# Patient Record
Sex: Male | Born: 1970 | Race: Black or African American | Hispanic: No | Marital: Married | Smoking: Current every day smoker
Health system: Southern US, Community
[De-identification: ages and names within clinical notes are randomized; demographics above are authoritative.]

## PROBLEM LIST (undated history)

## (undated) HISTORY — PX: OTHER SURGICAL HISTORY: SHX169

---

## 2001-02-28 ENCOUNTER — Emergency Department (HOSPITAL_COMMUNITY): Admission: EM | Admit: 2001-02-28 | Discharge: 2001-02-28 | Payer: Self-pay | Admitting: *Deleted

## 2003-05-06 ENCOUNTER — Emergency Department (HOSPITAL_COMMUNITY): Admission: AC | Admit: 2003-05-06 | Discharge: 2003-05-06 | Payer: Self-pay

## 2003-05-07 ENCOUNTER — Emergency Department (HOSPITAL_COMMUNITY): Admission: EM | Admit: 2003-05-07 | Discharge: 2003-05-07 | Payer: Self-pay | Admitting: Emergency Medicine

## 2003-08-07 ENCOUNTER — Emergency Department (HOSPITAL_COMMUNITY): Admission: EM | Admit: 2003-08-07 | Discharge: 2003-08-07 | Payer: Self-pay | Admitting: Emergency Medicine

## 2004-01-24 ENCOUNTER — Emergency Department (HOSPITAL_COMMUNITY): Admission: EM | Admit: 2004-01-24 | Discharge: 2004-01-24 | Payer: Self-pay | Admitting: Emergency Medicine

## 2004-01-29 ENCOUNTER — Ambulatory Visit: Payer: Self-pay | Admitting: Family Medicine

## 2004-01-31 ENCOUNTER — Ambulatory Visit: Payer: Self-pay | Admitting: Family Medicine

## 2004-06-23 ENCOUNTER — Ambulatory Visit: Payer: Self-pay | Admitting: Internal Medicine

## 2005-03-15 ENCOUNTER — Emergency Department (HOSPITAL_COMMUNITY): Admission: EM | Admit: 2005-03-15 | Discharge: 2005-03-15 | Payer: Self-pay | Admitting: Emergency Medicine

## 2005-04-18 ENCOUNTER — Emergency Department (HOSPITAL_COMMUNITY): Admission: EM | Admit: 2005-04-18 | Discharge: 2005-04-18 | Payer: Self-pay | Admitting: *Deleted

## 2007-12-08 ENCOUNTER — Encounter (INDEPENDENT_AMBULATORY_CARE_PROVIDER_SITE_OTHER): Payer: Self-pay | Admitting: *Deleted

## 2008-11-26 ENCOUNTER — Ambulatory Visit: Payer: Self-pay | Admitting: Nurse Practitioner

## 2008-11-26 DIAGNOSIS — K219 Gastro-esophageal reflux disease without esophagitis: Secondary | ICD-10-CM | POA: Insufficient documentation

## 2009-01-08 ENCOUNTER — Ambulatory Visit: Payer: Self-pay | Admitting: Nurse Practitioner

## 2009-01-08 DIAGNOSIS — F17200 Nicotine dependence, unspecified, uncomplicated: Secondary | ICD-10-CM

## 2009-01-08 DIAGNOSIS — K921 Melena: Secondary | ICD-10-CM

## 2009-01-09 DIAGNOSIS — A5601 Chlamydial cystitis and urethritis: Secondary | ICD-10-CM

## 2009-05-21 ENCOUNTER — Emergency Department (HOSPITAL_COMMUNITY): Admission: EM | Admit: 2009-05-21 | Discharge: 2009-05-22 | Payer: Self-pay | Admitting: Emergency Medicine

## 2009-06-04 ENCOUNTER — Ambulatory Visit (HOSPITAL_COMMUNITY): Admission: RE | Admit: 2009-06-04 | Discharge: 2009-06-05 | Payer: Self-pay | Admitting: Orthopedic Surgery

## 2009-06-26 ENCOUNTER — Emergency Department (HOSPITAL_COMMUNITY): Admission: EM | Admit: 2009-06-26 | Discharge: 2009-06-26 | Payer: Self-pay | Admitting: Family Medicine

## 2010-04-11 ENCOUNTER — Ambulatory Visit: Admit: 2010-04-11 | Payer: Self-pay | Admitting: Nurse Practitioner

## 2010-04-27 LAB — CONVERTED CEMR LAB
BUN: 15 mg/dL (ref 6–23)
Basophils Relative: 1 % (ref 0–1)
Bilirubin Urine: NEGATIVE
CO2: 25 meq/L (ref 19–32)
Calcium: 9.3 mg/dL (ref 8.4–10.5)
Chloride: 105 meq/L (ref 96–112)
Creatinine, Ser: 0.97 mg/dL (ref 0.40–1.50)
Eosinophils Absolute: 0.2 10*3/uL (ref 0.0–0.7)
Eosinophils Relative: 5 % (ref 0–5)
Glucose, Urine, Semiquant: NEGATIVE
HCT: 42.2 % (ref 39.0–52.0)
Helicobacter pylori, IgM: <0.89 (ref <0.89)
Lymphs Abs: 1.5 10*3/uL (ref 0.7–4.0)
MCHC: 33.6 g/dL (ref 30.0–36.0)
MCV: 89.2 fL (ref 78.0–100.0)
Monocytes Relative: 17 % — ABNORMAL HIGH (ref 3–12)
Platelets: 284 10*3/uL (ref 150–400)
Protein, U semiquant: NEGATIVE
TSH: 0.893 microintl units/mL (ref 0.350–4.500)
Total Bilirubin: 0.6 mg/dL (ref 0.3–1.2)
WBC: 3.6 10*3/uL — ABNORMAL LOW (ref 4.0–10.5)
pH: 5.5

## 2010-06-22 LAB — COMPREHENSIVE METABOLIC PANEL
ALT: 21 U/L (ref 0–53)
AST: 22 U/L (ref 0–37)
Albumin: 3.3 g/dL — ABNORMAL LOW (ref 3.5–5.2)
Alkaline Phosphatase: 50 U/L (ref 39–117)
Chloride: 104 mEq/L (ref 96–112)
GFR calc Af Amer: >60 mL/min (ref >60)
GFR calc non Af Amer: >60 mL/min (ref >60)
Potassium: 4.1 mEq/L (ref 3.5–5.1)
Sodium: 137 mEq/L (ref 135–145)
Total Bilirubin: 0.5 mg/dL (ref 0.3–1.2)

## 2010-06-22 LAB — POCT RAPID STREP A (OFFICE): Streptococcus, Group A Screen (Direct): NEGATIVE

## 2010-06-22 LAB — CBC
Platelets: 289 10*3/uL (ref 150–400)
WBC: 4.7 10*3/uL (ref 4.0–10.5)

## 2010-06-22 LAB — DIFFERENTIAL
Basophils Relative: 0 % (ref 0–1)
Eosinophils Absolute: 0.2 10*3/uL (ref 0.0–0.7)
Lymphs Abs: 1.3 10*3/uL (ref 0.7–4.0)
Monocytes Relative: 14 % — ABNORMAL HIGH (ref 3–12)
Neutro Abs: 2.6 10*3/uL (ref 1.7–7.7)
Neutrophils Relative %: 55 % (ref 43–77)

## 2010-10-02 ENCOUNTER — Emergency Department (HOSPITAL_COMMUNITY)
Admission: EM | Admit: 2010-10-02 | Discharge: 2010-10-02 | Disposition: A | Discharge status: Home / Self Care | Payer: Self-pay | Attending: Emergency Medicine | Admitting: Emergency Medicine

## 2010-10-02 DIAGNOSIS — S60569A Insect bite (nonvenomous) of unspecified hand, initial encounter: Secondary | ICD-10-CM | POA: Insufficient documentation

## 2010-10-02 DIAGNOSIS — W57XXXA Bitten or stung by nonvenomous insect and other nonvenomous arthropods, initial encounter: Secondary | ICD-10-CM | POA: Insufficient documentation

## 2010-10-02 DIAGNOSIS — R21 Rash and other nonspecific skin eruption: Secondary | ICD-10-CM | POA: Insufficient documentation

## 2012-03-21 ENCOUNTER — Emergency Department (INDEPENDENT_AMBULATORY_CARE_PROVIDER_SITE_OTHER)
Admission: EM | Admit: 2012-03-21 | Discharge: 2012-03-21 | Disposition: A | Discharge status: Home / Self Care | Payer: Self-pay | Source: Home / Self Care | Attending: Emergency Medicine | Admitting: Emergency Medicine

## 2012-03-21 ENCOUNTER — Encounter (HOSPITAL_COMMUNITY): Payer: Self-pay | Admitting: *Deleted

## 2012-03-21 DIAGNOSIS — H109 Unspecified conjunctivitis: Secondary | ICD-10-CM

## 2012-03-21 MED ORDER — HYDROXYPROPYL METHYLCELLULOSE 2.5 % OP SOLN: 1.0000 [drp] | Freq: Three times a day (TID) | OPHTHALMIC | Status: AC | PRN

## 2012-03-21 MED ORDER — TOBRAMYCIN 0.3 % OP SOLN: 1.0000 [drp] | Freq: Four times a day (QID) | OPHTHALMIC | Status: AC

## 2012-03-21 NOTE — ED Provider Notes (Signed)
History     CSN: [id number]  Arrival date & time 03/21/12  4098   First MD Initiated Contact with Patient 03/21/12 1931      No chief complaint on file.   (Consider location/radiation/quality/duration/timing/severity/associated sxs/prior treatment) HPI Comments: Patient presents to see urgent care complaining of left eye redness, tearing and discomfort. He denies any eye injury or any referring object sensation. Described that he's been having a cold for the last 3-4 days and have been outside working. Denies any changes in visio,fevers or headaches.  Patient is a 41 y.o. male presenting with conjunctivitis. The history is provided by the patient.  Conjunctivitis  The current episode started 3 to 5 days ago. The problem has been gradually worsening. Associated symptoms include eye itching, rhinorrhea and eye redness. Pertinent negatives include no decreased vision, no double vision, no photophobia, no nausea, no vomiting, no headaches, no hearing loss, no mouth sores, no swollen glands, no cough, no wheezing, no rash, no eye discharge and no eye pain. The eye pain is mild.    No past medical history on file.  No past surgical history on file.  No family history on file.  History  Substance Use Topics  . Smoking status: Not on file  . Smokeless tobacco: Not on file  . Alcohol Use: Not on file      Review of Systems  Constitutional: Negative for activity change and appetite change.  HENT: Positive for rhinorrhea. Negative for hearing loss and mouth sores.   Eyes: Positive for redness and itching. Negative for double vision, photophobia, pain, discharge and visual disturbance.  Respiratory: Negative for cough, shortness of breath and wheezing.   Gastrointestinal: Negative for nausea and vomiting.  Skin: Negative for rash and wound.  Neurological: Negative for dizziness, weakness and headaches.    Allergies  Review of patient's allergies indicates not on file.  Home  Medications   Current Outpatient Rx  Name  Route  Sig  Dispense  Refill  . HYDROXYPROPYL METHYLCELLULOSE 2.5 % OP SOLN   Right Eye   Place 1 drop into the right eye 3 (three) times daily as needed.   15 mL   12   . TOBRAMYCIN SULFATE 0.3 % OP SOLN   Right Eye   Place 1 drop into the right eye every 6 (six) hours.   5 mL   0     BP 115/74  Pulse 68  Temp 97.2 F (36.2 C) (Oral)  Resp 16  SpO2 98%  Physical Exam  Nursing note and vitals reviewed. Constitutional: Vital signs are normal. He appears well-developed and well-nourished.  Non-toxic appearance. He does not have a sickly appearance. He does not appear ill. No distress.  Eyes: No foreign bodies found. Right eye exhibits no discharge and no exudate. Left eye exhibits no discharge, no exudate and no hordeolum. No foreign body present in the left eye. Left conjunctiva has no hemorrhage. No scleral icterus. Left eye exhibits normal extraocular motion and no nystagmus.  Slit lamp exam:      The right eye shows no fluorescein uptake and no anterior chamber bulge.       The left eye shows no corneal abrasion, no corneal flare, no hyphema, no hypopyon, no fluorescein uptake and no anterior chamber bulge.  Pulmonary/Chest: Effort normal.  Skin: No erythema.    ED Course  Procedures (including critical care time)  Labs Reviewed - No data to display No results found.   1. Conjunctivitis  MDM  Possibly viral conjunctivitis as per recent cold-like symptoms. Patient has been prescribed tobramycin ophthalmic. Patient also advised to use artificial tears.        Jimmie Molly, MD 03/21/12 [phone]

## 2012-03-21 NOTE — ED Notes (Signed)
C/o pink eye OS onset Wednesday.  He tried clear eyes without relief.  Still red.  Has drainage in the corner of his eye in the morning.

## 2013-09-03 ENCOUNTER — Encounter (HOSPITAL_COMMUNITY): Payer: Self-pay | Admitting: Emergency Medicine

## 2013-09-03 ENCOUNTER — Emergency Department (INDEPENDENT_AMBULATORY_CARE_PROVIDER_SITE_OTHER)
Admission: EM | Admit: 2013-09-03 | Discharge: 2013-09-03 | Disposition: A | Discharge status: Home / Self Care | Payer: Self-pay | Source: Home / Self Care

## 2013-09-03 DIAGNOSIS — W57XXXA Bitten or stung by nonvenomous insect and other nonvenomous arthropods, initial encounter: Secondary | ICD-10-CM

## 2013-09-03 DIAGNOSIS — T148 Other injury of unspecified body region: Secondary | ICD-10-CM

## 2013-09-03 MED ORDER — FAMOTIDINE 20 MG PO TABS
ORAL_TABLET | ORAL | Status: AC
  Filled 2013-09-03: qty 1

## 2013-09-03 MED ORDER — TRIAMCINOLONE ACETONIDE 0.1 % EX CREA: TOPICAL_CREAM | CUTANEOUS | Status: DC

## 2013-09-03 MED ORDER — HYDROXYZINE HCL 25 MG PO TABS: 25.0000 mg | ORAL_TABLET | Freq: Four times a day (QID) | ORAL | Status: DC | PRN

## 2013-09-03 MED ORDER — PERMETHRIN 5 % EX CREA: TOPICAL_CREAM | CUTANEOUS | Status: DC

## 2013-09-03 MED ORDER — FAMOTIDINE 20 MG PO TABS
20.0000 mg | ORAL_TABLET | Freq: Once | ORAL | Status: AC
  Administered 2013-09-03: 20 mg via ORAL

## 2013-09-03 MED ORDER — PREDNISONE 20 MG PO TABS: ORAL_TABLET | ORAL | Status: DC

## 2013-09-03 MED ORDER — TRIAMCINOLONE ACETONIDE 40 MG/ML IJ SUSP
60.0000 mg | Freq: Once | INTRAMUSCULAR | Status: AC
  Administered 2013-09-03: 60 mg via INTRAMUSCULAR

## 2013-09-03 MED ORDER — TRIAMCINOLONE ACETONIDE 40 MG/ML IJ SUSP
INTRAMUSCULAR | Status: AC
  Filled 2013-09-03: qty 1

## 2013-09-03 NOTE — Discharge Instructions (Signed)
Bedbugs °Bedbugs are tiny bugs that live in and around beds. During the day, they hide in mattresses and other places near beds. They come out at night and bite people lying in bed. They need blood to live and grow. Bedbugs can be found in beds anywhere. Usually, they are found in places where many people come and go (hotels, shelters, hospitals). It does not matter whether the place is dirty or clean. °Getting bitten by bedbugs rarely causes a medical problem. The biggest problem can be getting rid of them.  This often takes the work of a pest control expert. °CAUSES °· Less use of pesticides. Bedbugs were common before the 1950s. Then, strong pesticides such as DDT nearly wiped them out. Today, these pesticides are not used because they harm the environment and can cause health problems. °· More travel. Besides mattresses, bedbugs can also live in clothing and luggage. They can come along as people travel from place to place. Bedbugs are more common in certain parts of the world. When people travel to those areas, the bugs can come home with them. °· Presence of birds and bats. Bedbugs often infest birds and bats. If you have these animals in or near your home, bedbugs may infest your house, too. °SYMPTOMS °It does not hurt to be bitten by a bedbug. You will probably not wake up when you are bitten. Bedbugs usually bite areas of the skin that are not covered. Symptoms may show when you wake up, or they may take a day or more to show up. Symptoms may include: °· Small red bumps on the skin. These might be lined up in a row or clustered in a group. °· A darker red dot in the middle of red bumps. °· Blisters on the skin. There may be swelling and very bad itching. These may be signs of an allergic reaction. This does not happen often. °DIAGNOSIS °Bedbug bites might look and feel like other types of insect bites. The bugs do not stay on the body like ticks or lice. They bite, drop off, and crawl away to hide. Your  caregiver will probably: °· Ask about your symptoms. °· Ask about your recent activities and travel. °· Check your skin for bedbug bites. °· Ask you to check at home for signs of bedbugs. You should look for: °· Spots or stains on the bed or nearby. This could be from bedbugs that were crushed or from their eggs or waste. °· Bedbugs themselves. They are reddish-brown, oval, and flat. They do not fly. They are about the size of an apple seed. °· Places to look for bedbugs include: °· Beds. Check mattresses, headboards, box springs, and bed frames. °· On drapes and curtains near the bed. °· Under carpeting in the bedroom. °· Behind electrical outlets. °· Behind any wallpaper that is peeling. °· Inside luggage. °TREATMENT °Most bedbug bites do not need treatment. They usually go away on their own in a few days. The bites are not dangerous. However, treatment may be needed if you have scratched so much that your skin has become infected. You may also need treatment if you are allergic to bedbug bites. Treatment options include: °· A drug that stops swelling and itching (corticosteroid). Usually, a cream is rubbed on the skin. If you have a bad rash, you may be given a corticosteroid pill. °· Oral antihistamines. These are pills to help control itching. °· Antibiotic medicines. An antibiotic may be prescribed for infected skin. °HOME CARE INSTRUCTIONS  °·   Take any medicine prescribed by your caregiver for your bites. Follow the directions carefully. °· Consider wearing pajamas with long sleeves and pant legs. °· Your bedroom may need to be treated. A pest control expert should make sure the bedbugs are gone. You may need to throw away mattresses or luggage. Ask the pest control expert what you can do to keep the bedbugs from coming back. Common suggestions include: °· Putting a plastic cover over your mattress. °· Washing and drying your clothes and bedding in hot water and a hot dryer. The temperature should be hotter  than 120° F (48.9° C). Bedbugs are killed by high temperatures. °· Vacuuming carefully all around your bed. Vacuum in all cracks and crevices where the bugs might hide. Do this often. °· Carefully checking all used furniture, bedding, or clothes that you bring into your house. °· Eliminating bird nests and bat roosts. °· If you get bedbug bites when traveling, check all your possessions carefully before bringing them into your house. If you find any bugs on clothes or in your luggage, consider throwing those items away. °SEEK MEDICAL CARE IF: °· You have red bug bites that keep coming back. °· You have red bug bites that itch badly. °· You have bug bites that cause a skin rash. °· You have scratch marks that are red and sore. °SEEK IMMEDIATE MEDICAL CARE IF: °You have a fever. °Document Released: 04/18/2010 Document Revised: 06/08/2011 Document Reviewed: 04/18/2010 °[employer]® Patient Information ©2014 [employer]. ° °Insect Bite °Mosquitoes, flies, fleas, bedbugs, and many other insects can bite. Insect bites are different from insect stings. A sting is when venom is injected into the skin. Some insect bites can transmit infectious diseases. °SYMPTOMS  °Insect bites usually turn red, swell, and itch for 2 to 4 days. They often go away on their own. °TREATMENT  °Your caregiver may prescribe antibiotic medicines if a bacterial infection develops in the bite. °HOME CARE INSTRUCTIONS °· Do not scratch the bite area. °· Keep the bite area clean and dry. Wash the bite area thoroughly with soap and water. °· Put ice or cool compresses on the bite area. °· Put ice in a plastic bag. °· Place a towel between your skin and the bag. °· Leave the ice on for 20 minutes, 4 times a day for the first 2 to 3 days, or as directed. °· You may apply a baking soda paste, cortisone cream, or calamine lotion to the bite area as directed by your caregiver. This can help reduce itching and swelling. °· Only take over-the-counter or  prescription medicines as directed by your caregiver. °· If you are given antibiotics, take them as directed. Finish them even if you start to feel better. °You may need a tetanus shot if: °· You cannot remember when you had your last tetanus shot. °· You have never had a tetanus shot. °· The injury broke your skin. °If you get a tetanus shot, your arm may swell, get red, and feel warm to the touch. This is common and not a problem. If you need a tetanus shot and you choose not to have one, there is a rare chance of getting tetanus. Sickness from tetanus can be serious. °SEEK IMMEDIATE MEDICAL CARE IF:  °· You have increased pain, redness, or swelling in the bite area. °· You see a red line on the skin coming from the bite. °· You have a fever. °· You have joint pain. °· You have a headache or neck   pain. °· You have unusual weakness. °· You have a rash. °· You have chest pain or shortness of breath. °· You have abdominal pain, nausea, or vomiting. °· You feel unusually tired or sleepy. °MAKE SURE YOU:  °· Understand these instructions. °· Will watch your condition. °· Will get help right away if you are not doing well or get worse. °Document Released: 04/23/2004 Document Revised: 06/08/2011 Document Reviewed: 10/15/2010 °ExitCare® Patient Information ©2014 [employer]. ° °

## 2013-09-03 NOTE — ED Notes (Signed)
States he noted itching and raised red areas on arms, neck, legs after visiting a friend's house last pm. NAD, w/d/color good, diffuse raised red circular areas noted he says are itchy. Minimal relief w benadryl

## 2013-09-03 NOTE — ED Provider Notes (Signed)
CSN: [id number]     Arrival date & time 09/03/13  1556 History   First MD Initiated Contact with Patient 09/03/13 1729     Chief Complaint  Patient presents with  . Skin Problem   (Consider location/radiation/quality/duration/timing/severity/associated sxs/prior Treatment) HPI Comments: 43 year old male went to a friend's house yesterday and sat on his couch. A few hours later he developed red nail did pruritic lesions scattered about his body surface areas. These are primarily concentrated to the upper extremities and the neck. Highly pruritic.   History reviewed. No pertinent past medical history. Past Surgical History  Procedure Laterality Date  . R hand surgery      fracture 4th and 5th metacarpal   Family History  Problem Relation Age of Onset  . Cancer Father    History  Substance Use Topics  . Smoking status: Current Every Day Smoker -- 1.00 packs/day    Types: Cigarettes  . Smokeless tobacco: Not on file  . Alcohol Use: Yes     Comment: occasional    Review of Systems  Constitutional: Negative for diaphoresis, activity change and fatigue.  HENT: Negative.   Respiratory: Negative.   Gastrointestinal: Negative.   Genitourinary: Negative.   Skin: Positive for rash.  Neurological: Negative.     Allergies  Review of patient's allergies indicates no known allergies.  Home Medications   Prior to Admission medications   Medication Sig Start Date End Date Taking? Authorizing Provider  hydrOXYzine (ATARAX/VISTARIL) 25 MG tablet Take 1 tablet (25 mg total) by mouth every 6 (six) hours as needed. For itching. May cause drowsiness. 09/03/13   Hayden Rasmussen, NP  Naphazoline-Glycerin (CLEAR EYES MAX REDNESS RELIEF OP) Apply 2 drops to eye QID.    Historical Provider, MD  permethrin (ELIMITE) 5 % cream Apply from chin to feet. Rinse off in 8 hours. 09/03/13   Hayden Rasmussen, NP  predniSONE (DELTASONE) 20 MG tablet 2 tabs po once daily x 5 days. Start 09/04/13. Take with food. 09/03/13    Hayden Rasmussen, NP  triamcinolone cream (KENALOG) 0.1 % Apply to affected areas bid prn rash. 09/03/13   Hayden Rasmussen, NP   BP 151/99  Pulse 91  Temp(Src) 98.8 F (37.1 C) (Oral)  Resp 16  SpO2 98% Physical Exam  Nursing note and vitals reviewed. Constitutional: He is oriented to person, place, and time. He appears well-developed and well-nourished. No distress.  Neck: Normal range of motion. Neck supple.  Pulmonary/Chest: Effort normal. No respiratory distress.  Musculoskeletal: He exhibits no edema.  Neurological: He is alert and oriented to person, place, and time.  Skin: Skin is warm and dry.  Multiple, isolated red papules involving the neck and upper extremities and lesser to the torso and lower extremities. These are rather prominent and can be seen at a distance.  Psychiatric: He has a normal mood and affect.    ED Course  Procedures (including critical care time) Labs Review Labs Reviewed - No data to display  Imaging Review No results found.   MDM   1. Multiple insect bites     Elimite cream as dir Triamcinolone cr as directed, dilute and use as lotion Kenalog 60 mg IM now Tomorrow prednisone 40 mg q d x 5 d. Famotidine 20 mg now Atarax 25 q 6h prn Read instructions for home cleaning    Hayden Rasmussen, NP 09/03/13 1753

## 2013-09-03 NOTE — ED Provider Notes (Signed)
Medical screening examination/treatment/procedure(s) were performed by a resident physician or non-physician practitioner and as the supervising physician I was immediately available for consultation/collaboration.  Cassidi Modesitt, MD    Ashaunti Treptow S Tatym Schermer, MD 09/03/13 1922 

## 2014-06-25 ENCOUNTER — Encounter (HOSPITAL_COMMUNITY): Payer: Self-pay | Admitting: *Deleted

## 2014-06-25 ENCOUNTER — Emergency Department (HOSPITAL_COMMUNITY)
Admission: EM | Admit: 2014-06-25 | Discharge: 2014-06-26 | Disposition: A | Discharge status: Home / Self Care | Payer: Self-pay | Attending: Emergency Medicine | Admitting: Emergency Medicine

## 2014-06-25 DIAGNOSIS — Z79899 Other long term (current) drug therapy: Secondary | ICD-10-CM | POA: Insufficient documentation

## 2014-06-25 DIAGNOSIS — F1490 Cocaine use, unspecified, uncomplicated: Secondary | ICD-10-CM

## 2014-06-25 DIAGNOSIS — Z720 Tobacco use: Secondary | ICD-10-CM | POA: Insufficient documentation

## 2014-06-25 DIAGNOSIS — F1410 Cocaine abuse, uncomplicated: Secondary | ICD-10-CM | POA: Insufficient documentation

## 2014-06-25 MED ORDER — ONDANSETRON 4 MG PO TBDP
4.0000 mg | ORAL_TABLET | Freq: Once | ORAL | Status: AC
  Administered 2014-06-26: 4 mg via ORAL
  Filled 2014-06-25: qty 1

## 2014-06-25 MED ORDER — ACETAMINOPHEN 325 MG PO TABS
650.0000 mg | ORAL_TABLET | Freq: Once | ORAL | Status: AC
  Administered 2014-06-26: 650 mg via ORAL
  Filled 2014-06-25: qty 2

## 2014-06-25 NOTE — ED Notes (Signed)
Pt in stating he wants help to stop using cocaine, last use a few hours ago, denies using anything else, states he needs help to stop so that he can stay with his family and get off the streets, states he has been using for years, reports not eating for 4 days, also n/v at times, denies pain at this time

## 2014-06-25 NOTE — ED Provider Notes (Signed)
CSN: [id number]     Arrival date & time 06/25/14  1947 History   None    This chart was scribed for Derwood Kaplan, MD by Arlan Organ, ED Scribe. This patient was seen in room C26C/C26C and the patient's care was started 11:20 PM.   Chief Complaint  Patient presents with  . Addiction Problem   HPI  HPI Comments Calvin Newman is a 44 y.o. male without any pertinent past medical history who presents to the Emergency Department here for an addiction problem this evening. 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 Last use this evening at 6 PM and is requesting detox at this time. Calvin Newman denies being an every day drug user but states he occasionally uses Cocaine from time to time. He now reports nausea. Pt states "i feel dehydrated". Pt also mentions "my chest felt like it locked up yesterday", however, this has now resolved. Pt reports mild headache, no vision complains. Calvin Newman states he lives at home with his children. No known allergies to medications.  History reviewed. No pertinent past medical history. Past Surgical History  Procedure Laterality Date  . R hand surgery      fracture 4th and 5th metacarpal   Family History  Problem Relation Age of Onset  . Cancer Father    History  Substance Use Topics  . Smoking status: Current Every Day Smoker -- 1.00 packs/day    Types: Cigarettes  . Smokeless tobacco: Not on file  . Alcohol Use: Yes     Comment: occasional    Review of Systems  Eyes: Negative for visual disturbance.  Respiratory: Negative for shortness of breath.   Cardiovascular: Negative for chest pain.  Gastrointestinal: Positive for nausea.  Neurological: Positive for headaches.      Allergies  Review of patient's allergies indicates no known allergies.  Home Medications   Prior to Admission medications   Medication Sig Start Date End Date Taking? Authorizing Provider  hydrOXYzine (ATARAX/VISTARIL) 25 MG tablet Take 1 tablet (25 mg  total) by mouth every 6 (six) hours as needed. For itching. May cause drowsiness. Patient not taking: Reported on 06/25/2014 09/03/13   Hayden Rasmussen, NP  ondansetron (ZOFRAN ODT) 8 MG disintegrating tablet Take 1 tablet (8 mg total) by mouth every 8 (eight) hours as needed for nausea. 06/26/14   Derwood Kaplan, MD  permethrin (ELIMITE) 5 % cream Apply from chin to feet. Rinse off in 8 hours. Patient not taking: Reported on 06/25/2014 09/03/13   Hayden Rasmussen, NP  predniSONE (DELTASONE) 20 MG tablet 2 tabs po once daily x 5 days. Start 09/04/13. Take with food. Patient not taking: Reported on 06/25/2014 09/03/13   Hayden Rasmussen, NP  triamcinolone cream (KENALOG) 0.1 % Apply to affected areas bid prn rash. Patient not taking: Reported on 06/25/2014 09/03/13   Hayden Rasmussen, NP   Triage Vitals: BP 161/93 mmHg  Pulse 104  Temp(Src) 98.5 F (36.9 C) (Oral)  Resp 20  Wt 170 lb (77.111 kg)  SpO2 100%   Physical Exam  Constitutional: He is oriented to person, place, and time. He appears well-developed and well-nourished.  HENT:  Head: Normocephalic and atraumatic.  Eyes: EOM are normal. Right eye exhibits no nystagmus. Left eye exhibits no nystagmus.  Pupils 4 millimeters and equal  Neck: Normal range of motion.  Cardiovascular: Normal rate, regular rhythm, normal heart sounds and intact distal pulses.   No murmur heard. Pulmonary/Chest: Effort normal and breath sounds normal. No respiratory distress.  Lungs clear to auscultation   Abdominal: Soft. He exhibits no distension. There is no tenderness.  Musculoskeletal: Normal range of motion.  Neurological: He is alert and oriented to person, place, and time. No cranial nerve deficit. Coordination normal.  Skin: Skin is warm and dry. He is not diaphoretic.  Psychiatric: He has a normal mood and affect. Judgment normal.  Nursing note and vitals reviewed.   ED Course  Procedures (including critical care time)  DIAGNOSTIC STUDIES: Oxygen Saturation is 100% on RA,  Normal by my interpretation.    COORDINATION OF CARE: 1:11 AM-Discussed treatment plan with pt at bedside and pt agreed to plan.     Labs Review Labs Reviewed - No data to display  Imaging Review No results found.   EKG Interpretation   Date/Time:  Tuesday June 26 2014 00:49:21 EDT Ventricular Rate:  65 PR Interval:  128 QRS Duration: 82 QT Interval:  408 QTC Calculation: 424 R Axis:   75 Text Interpretation:  Normal sinus rhythm with sinus arrhythmia Normal ECG  J point elevation Anterior leads No old tracing to compare Confirmed by  Rhunette CroftNANAVATI, MD, Janey GentaANKIT [phone]([id number]) on 06/26/2014 1:03:28 AM      MDM   Final diagnoses:  Cocaine use   I personally performed the services described in this documentation, which was scribed in my presence. The recorded information has been reviewed and is accurate.  Pt comes in with cc of nausea and seeking resources for cocaine use. Pt currently has mild headache and nausea. He is neurovascularly intact. Will give outpatient resources. Screening ekg is normal. Strict return precautions discussed. Pt is comfortable with the plan.  Derwood KaplanAnkit Scarleth Brame, MD 06/26/14 [phone]

## 2014-06-26 MED ORDER — ONDANSETRON 8 MG PO TBDP: 8.0000 mg | ORAL_TABLET | Freq: Three times a day (TID) | ORAL | Status: DC | PRN

## 2014-06-26 NOTE — Discharge Instructions (Signed)
We saw you in the ER for the resources on addiction. Pease follow up at one of the resources provided. Please refrain from substance abuse. Return to the ER if your symptoms worsen, especially if there is chest pain, severe headaches.  Stimulant Use Disorder-Cocaine Cocaine is one of a group of powerful drugs called stimulants. Cocaine has medical uses for stopping nosebleeds and for pain control before minor nose or dental surgery. However, cocaine is misused because of the effects that it produces. These effects include:   A feeling of extreme pleasure.  Alertness.  High energy. Common street names for cocaine include coke, crack, blow, snow, and nose candy. Cocaine is snorted, dissolved in water and injected, or smoked.  Stimulants are addictive because they activate regions of the brain that produce both the pleasurable sensation of "reward" and psychological dependence. Together, these actions account for loss of control and the rapid development of drug dependence. This means you become ill without the drug (withdrawal) and need to keep using it to function.  Stimulant use disorder is use of stimulants that disrupts your daily life. It disrupts relationships with family and friends and how you do your job. Cocaine increases your blood pressure and heart rate. It can cause a heart attack or stroke. Cocaine can also cause death from irregular heart rate or seizures. SYMPTOMS Symptoms of stimulant use disorder with cocaine include:  Use of cocaine in larger amounts or over a longer period of time than intended.  Unsuccessful attempts to cut down or control cocaine use.  A lot of time spent obtaining, using, or recovering from the effects of cocaine.  A strong desire or urge to use cocaine (craving).  Continued use of cocaine in spite of major problems at work, school, or home because of use.  Continued use of cocaine in spite of relationship problems because of use.  Giving up or  cutting down on important life activities because of cocaine use.  Use of cocaine over and over in situations when it is physically hazardous, such as driving a car.  Continued use of cocaine in spite of a physical problem that is likely related to use. Physical problems can include:  Malnutrition.  Nosebleeds.  Chest pain.  High blood pressure.  A hole that develops between the part of your nose that separates your nostrils (perforated nasal septum).  Lung and kidney damage.  Continued use of cocaine in spite of a mental problem that is likely related to use. Mental problems can include:  Schizophrenia-like symptoms.  Depression.  Bipolar mood swings.  Anxiety.  Sleep problems.  Need to use more and more cocaine to get the same effect, or lessened effect over time with use of the same amount of cocaine (tolerance).  Having withdrawal symptoms when cocaine use is stopped, or using cocaine to reduce or avoid withdrawal symptoms. Withdrawal symptoms include:  Depressed or irritable mood.  Low energy or restlessness.  Bad dreams.  Poor or excessive sleep.  Increased appetite. DIAGNOSIS Stimulant use disorder is diagnosed by your health care provider. You may be asked questions about your cocaine use and how it affects your life. A physical exam may be done. A drug screen may be ordered. You may be referred to a mental health professional. The diagnosis of stimulant use disorder requires at least two symptoms within 12 months. The type of stimulant use disorder depends on the number of signs and symptoms you have. The type may be:  Mild. Two or three signs  and symptoms.  Moderate. Four or five signs and symptoms.  Severe. Six or more signs and symptoms. TREATMENT Treatment for stimulant use disorder is usually provided by mental health professionals with training in substance use disorders. The following options are available:  Counseling or talk therapy. Talk  therapy addresses the reasons you use cocaine and ways to keep you from using again. Goals of talk therapy include:  Identifying and avoiding triggers for use.  Handling cravings.  Replacing use with healthy activities.  Support groups. Support groups provide emotional support, advice, and guidance.  Medicine. Certain medicines may decrease cocaine cravings or withdrawal symptoms. HOME CARE INSTRUCTIONS  Take medicines only as directed by your health care provider.  Identify the people and activities that trigger your cocaine use and avoid them.  Keep all follow-up visits as directed by your health care provider. SEEK MEDICAL CARE IF:  Your symptoms get worse or you relapse.  You are not able to take medicines as directed. SEEK IMMEDIATE MEDICAL CARE IF:  You have serious thoughts about hurting yourself or others.  You have a seizure, chest pain, sudden weakness, or loss of speech or vision. FOR MORE INFORMATION  National Institute on Drug Abuse: http://www.price-smith.com/  Substance Abuse and Mental Health Services Administration: SkateOasis.com.pt Document Released: 03/13/2000 Document Revised: 07/31/2013 Document Reviewed: 03/29/2013 [facility] Patient Information 2015 [place], [place]. This information is not intended to replace advice given to you by your health care provider. Make sure you discuss any questions you have with your health care provider.     Emergency Department Resource Guide 1) Find a Doctor and Pay Out of Pocket Although you won't have to find out who is covered by your insurance plan, it is a good idea to ask around and get recommendations. You will then need to call the office and see if the doctor you have chosen will accept you as a new patient and what types of options they offer for patients who are self-pay. Some doctors offer discounts or will set up payment plans for their patients who do not have insurance, but you will need to ask so you aren't surprised  when you get to your appointment.  2) Contact Your Local Health Department Not all health departments have doctors that can see patients for sick visits, but many do, so it is worth a call to see if yours does. If you don't know where your local health department is, you can check in your phone book. The CDC also has a tool to help you locate your state's health department, and many state websites also have listings of all of their local health departments.  3) Find a Walk-in Clinic If your illness is not likely to be very severe or complicated, you may want to try a walk in clinic. These are popping up all over the country in pharmacies, drugstores, and shopping centers. They're usually staffed by nurse practitioners or physician assistants that have been trained to treat common illnesses and complaints. They're usually fairly quick and inexpensive. However, if you have serious medical issues or chronic medical problems, these are probably not your best option.  No Primary Care Doctor: - Call Health Connect at  [phone] - they can help you locate a primary care doctor that  accepts your insurance, provides certain services, etc. - Physician Referral Service- [phone]  Chronic Pain Problems: Organization         Address  Phone   Notes  [facility]  [phone] Patients need to be referred by their primary care doctor.   Medication Assistance: Organization         Address  Phone   Notes  [facility] Med[facility] [address] [place] [place], [place] [address] [phone] --Must be a resident of [facility] -- Must have NO insurance coverage whatsoever (no Medicaid/ Medicare, etc.) -- The pt. MUST have a primary care doctor that directs their care regularly and follows them in the community   [facility]  [phone]   [employer]  [phone]    Agencies that provide inexpensive medical care: Organization          Address  Phone   Notes  [facility]  [phone]   [facility]    [phone]   [facility] [address] [place], [place] [address] [phone]   [facility] [address], [place] [phone]   Planned Parenthood    [phone]   [facility]    [phone]   Community Health and [facility]  [address], [place] Phone:  [phone], Fax:  [phone] Hours of Operation:  9 am - 6 pm, M-F.  Also accepts Medicaid/Medicare and self-pay.  [facility]  [address], [place] Phone: [phone], Fax: [phone]. Hours of Operation:  8:30 am - 5:30 pm, M-F.  Also accepts Medicaid and self-pay.  [facility] [place] [address] [place] Phone: [phone]   [facility] [address] Natasha Bence [place], [place] [phone], Ext. [phone] Mondays & Thursdays: 7-9 AM.  First 15 patients are seen on a first come, first serve basis.    Medicaid-accepting [facility] Providers:  Organization         Address  Phone   Notes  [facility] [address], [place] [phone] Also accepts self-pay patients.  [facility] [address] Laurell Josephs [place], [place]  [phone]   [facility] [address], [place] [phone]   [facility] [address], [place] [phone]   Renaye Rakers [address], [place]   [phone] Only accepts [place] patients after they have their name applied to their card.   Self-Pay (no insurance) in [facility]:  Organization         Address  Phone   Notes  Sickle Cell Patients, [facility] [address] [place], [place] [phone]   [facility] [address] [place], [place] [phone]    [facility] [place]  [address], [place] [phone]   [facility]/Dr. Osei-Bonsu  [address] [place] or [address], [place] [phone] Phone number for both [place] and [place] locations is the same.  [facility] [address], [place] [phone]   [facility] [address] [place] or [address] [phone]   [facility] [address], [place] [phone], phone; [phone], fax Sees patients 1st and 3rd Saturday of every month.  Must not qualify for public or private insurance (i.e. Medicaid, Medicare, [place] Health Choice, Veterans' Benefits)  Household income should be no more than 200% of the poverty level The clinic cannot treat you if you are pregnant or think you are pregnant  Sexually transmitted diseases are not treated at the clinic.    Dental Care: Organization         Address  Phone  Notes  [facility] [address], [place] [phone] Accepts children up to age 46 who are enrolled in [place] or [place] Health Choice; pregnant women with a Medicaid card; and children who have applied for Medicaid or [place] Health Choice, but were declined, whose parents can pay a reduced fee at time of service.  [facility] [place]  [address], [place] [phone] Accepts children up to age 46 who are enrolled in [place] or [place] Health Choice; pregnant women with a Medicaid card; and children who have applied for Medicaid or [place] Health Choice, but were declined, whose parents can pay a reduced fee at time of service.  [facility]  [address], [place] [phone] Patients are seen by appointment only. Walk-ins are not accepted. [facility] will see patients 6418  years of age and older. Monday - Tuesday (8am-5pm) Most Wednesdays (8:30-5pm) $30 per visit, cash only  [facility]  [address], [facility] [phone] Patients are seen by appointment only. Walk-ins are not accepted. [facility] will see patients 818 years of age and older. One Wednesday Evening (Monthly: Volunteer Based).  $30 per visit, cash only  [employer] [facility] [facility]  [phone] for adults; Children under age 144, call Graduate Pediatric Dentistry at [phone]. Children aged 524-14, please call [phone] to request a pediatric application.  Dental services are provided in all areas of dental care including fillings, crowns and bridges, complete and partial dentures, implants, gum treatment, root canals, and extractions. Preventive care is also provided. Treatment is provided to both adults and children. Patients are selected via a lottery and there is often a waiting list.   [facility] [address], [place]  [phone] [email or web address]   [facility] [address], [place], [place] [phone], Ext. [phone] Second and Fourth Thursday of each month, opens at 6:30 AM; Clinic ends at 9 AM.  Patients are seen on a first-come first-served basis, and a limited number are seen during each clinic.   [facility]  [address], [place], [place] [phone]   Eligibility Requirements You must have lived in [place], [place], or [place] counties for at least the last three months.   You cannot be eligible for state or federal sponsored [place]healthcare insurance, including CIGNAVeterans Administration, [place]icaid, or [employer]icare.   You generally cannot be eligible for healthcare insurance through your employer.    How to apply: Eligibility screenings are held every Tuesday and Wednesday afternoon from 1:00 pm until 4:00 pm. You do not need an appointment for the interview!  [facility]  [address], [place], [place] [phone]   [facility]  [phone]   [facility]  [phone]   [facility]  [phone]    Behavioral Health Resources in the Community: Intensive Outpatient Programs Organization         Address  Phone  Notes  [facility] [address], [place],  [place] [phone]   [facility] Outpatient [address] [place], [place] [phone]   [facility] [address] [place], [place]  [phone]   [facility] Health [address]  [place], [place] [phone] or [phone]   Substance Abuse Resources Organization         Address  Phone  Notes  [facility]  [phone]   [facility]  [phone]   [address] [place]  [phone]   Floydene Flock  [phone]   Residential & Outpatient Substance Abuse Program  [phone]   Psychological Services Organization         Address  Phone  Notes  [facility]  [phone]   [facility]  [phone]   [facility] [address], [place] [phone] or [phone]    Mobile Crisis Teams Organization         Address  Phone  Notes  [facility], Mobile Crisis Care Unit  [phone]   [facility]  [address] [place], [place] [phone]   Doristine Locks [address] [place] [phone]    Self-Help/Support Groups Organization         Address  Phone             Notes  [facility] [place] - variety of support groups  [phone]- [phone] Call for more information  Narcotics Anonymous (NA), [facility] [address], [employer] [place]  2 meetings at this location   Statistician         Address  Phone  Notes  [facility] [address],    [place]   [phone]   [facility]  [address], [place] [address], [place], [place] [phone]   [facility] [address] [place], [place] [phone] Admissions: 8am-3pm M-F  [facility] [address],    [place], [place] [phone]   [facility] [address] [place], [place], [place] [phone]   [facility] [address]  [place], [place] [phone]   [facility] - Intensive Outpatient [address] Laurell Josephs [address], [place], [place] [phone]   [facility] [address] [place]  [place], [place] [phone] or [phone]   [facility] ([facility]) [address] [place], [place] [phone] Accepts Medicaid  [facility] [address],  [place] [phone] Substance Abuse/Addiction Treatment   [facility] Organization         Address  Phone  Notes  [facility]  [phone]   Angie Fava, PhD [address] Ervin Knack [place], [place]   [phone] or [phone]   [facility]   [address] [place], [place] [phone]   [facility] [address] [place], [place] [phone] Insurance/Medicaid/sponsorship through [facility] [address]                                    [place], [place] [phone] Therapy/tele-psych/case  [facility] [address], [place] [phone]    Dr. Lolly Mustache  [phone]   [facility]  [employer] [facility] Dept. 1) [address] [place] 2) [address] [facility] [address], [place] °3)  [address], [place] [phone] °[phone] ° °[phone]   °[place] Child Abuse Hotline [phone]) 342-1394 or [phone] (After Hours)    ° ° ° °

## 2015-04-24 ENCOUNTER — Encounter (HOSPITAL_COMMUNITY): Payer: Self-pay | Admitting: Adult Health

## 2015-04-24 ENCOUNTER — Emergency Department (HOSPITAL_COMMUNITY)
Admission: EM | Admit: 2015-04-24 | Discharge: 2015-04-24 | Disposition: A | Discharge status: Home / Self Care | Payer: Self-pay | Attending: Emergency Medicine | Admitting: Emergency Medicine

## 2015-04-24 DIAGNOSIS — F329 Major depressive disorder, single episode, unspecified: Secondary | ICD-10-CM | POA: Insufficient documentation

## 2015-04-24 DIAGNOSIS — F1010 Alcohol abuse, uncomplicated: Secondary | ICD-10-CM | POA: Insufficient documentation

## 2015-04-24 DIAGNOSIS — F1410 Cocaine abuse, uncomplicated: Secondary | ICD-10-CM | POA: Insufficient documentation

## 2015-04-24 DIAGNOSIS — F1210 Cannabis abuse, uncomplicated: Secondary | ICD-10-CM | POA: Insufficient documentation

## 2015-04-24 DIAGNOSIS — F17210 Nicotine dependence, cigarettes, uncomplicated: Secondary | ICD-10-CM | POA: Insufficient documentation

## 2015-04-24 LAB — RAPID URINE DRUG SCREEN, HOSP PERFORMED
Amphetamines: NOT DETECTED
BARBITURATES: NOT DETECTED
Benzodiazepines: NOT DETECTED
COCAINE: POSITIVE — AB
Opiates: NOT DETECTED
TETRAHYDROCANNABINOL: POSITIVE — AB

## 2015-04-24 LAB — BASIC METABOLIC PANEL
Anion gap: 16 — ABNORMAL HIGH (ref 5–15)
BUN: 18 mg/dL (ref 6–20)
CHLORIDE: 97 mmol/L — AB (ref 101–111)
CO2: 26 mmol/L (ref 22–32)
CREATININE: 1.35 mg/dL — AB (ref 0.61–1.24)
Calcium: 9 mg/dL (ref 8.9–10.3)
GFR calc non Af Amer: >60 mL/min (ref >60)
Glucose, Bld: 104 mg/dL — ABNORMAL HIGH (ref 65–99)
Potassium: 3.6 mmol/L (ref 3.5–5.1)
Sodium: 139 mmol/L (ref 135–145)

## 2015-04-24 LAB — CBC WITH DIFFERENTIAL/PLATELET
Basophils Absolute: 0 10*3/uL (ref 0.0–0.1)
Basophils Relative: 0 %
Eosinophils Absolute: 0.1 10*3/uL (ref 0.0–0.7)
Eosinophils Relative: 1 %
HEMATOCRIT: 45.1 % (ref 39.0–52.0)
HEMOGLOBIN: 15.2 g/dL (ref 13.0–17.0)
LYMPHS ABS: 2.1 10*3/uL (ref 0.7–4.0)
LYMPHS PCT: 36 %
MCH: 29.3 pg (ref 26.0–34.0)
MCHC: 33.7 g/dL (ref 30.0–36.0)
MCV: 87.1 fL (ref 78.0–100.0)
MONOS PCT: 18 %
Monocytes Absolute: 1 10*3/uL (ref 0.1–1.0)
NEUTROS ABS: 2.7 10*3/uL (ref 1.7–7.7)
NEUTROS PCT: 45 %
Platelets: 313 10*3/uL (ref 150–400)
RBC: 5.18 MIL/uL (ref 4.22–5.81)
RDW: 14.1 % (ref 11.5–15.5)
WBC: 5.9 10*3/uL (ref 4.0–10.5)

## 2015-04-24 LAB — ETHANOL

## 2015-04-24 LAB — SALICYLATE LEVEL: Salicylate Lvl: <4 mg/dL (ref 2.8–30.0)

## 2015-04-24 LAB — ACETAMINOPHEN LEVEL

## 2015-04-24 NOTE — ED Notes (Signed)
Presents requesting medical clearance to go to a rehab facility for cocaine and alcohol abuse. Last alcohol drink 04/22/15 at 8 pm, denies DTs, denies nausea and vomitng, last cocaine use 9 pm 04/23/15. Has been using on a binge since Sunday.  VSS.

## 2015-04-24 NOTE — Discharge Instructions (Signed)
Polysubstance Abuse Mr. Hopwood, you have been medically clear to go to rehab for help with substance abuse.  Please report directly to your rehab center.  For any worsening symptoms, come back to the ED immediately. Thank you. When people abuse more than one drug or type of drug it is called polysubstance or polydrug abuse. For example, many smokers also drink alcohol. This is one form of polydrug abuse. Polydrug abuse also refers to the use of a drug to counteract an unpleasant effect produced by another drug. It may also be used to help with withdrawal from another drug. People who take stimulants may become agitated. Sometimes this agitation is countered with a tranquilizer. This helps protect against the unpleasant side effects. Polydrug abuse also refers to the use of different drugs at the same time.  Anytime drug use is interfering with normal living activities, it has become abuse. This includes problems with family and friends. Psychological dependence has developed when your mind tells you that the drug is needed. This is usually followed by physical dependence which has developed when continuing increases of drug are required to get the same feeling or "high". This is known as addiction or chemical dependency. A person's risk is much higher if there is a history of chemical dependency in the family. SIGNS OF CHEMICAL DEPENDENCY  You have been told by friends or family that drugs have become a problem.  You fight when using drugs.  You are having blackouts (not remembering what you do while using).  You feel sick from using drugs but continue using.  You lie about use or amounts of drugs (chemicals) used.  You need chemicals to get you going.  You are suffering in work performance or in school because of drug use.  You get sick from use of drugs but continue to use anyway.  You need drugs to relate to people or feel comfortable in social situations.  You use drugs to forget  problems. "Yes" answered to any of the above signs of chemical dependency indicates there are problems. The longer the use of drugs continues, the greater the problems will become. If there is a family history of drug or alcohol use, it is best not to experiment with these drugs. Continual use leads to tolerance. After tolerance develops more of the drug is needed to get the same feeling. This is followed by addiction. With addiction, drugs become the most important part of life. It becomes more important to take drugs than participate in the other usual activities of life. This includes relating to friends and family. Addiction is followed by dependency. Dependency is a condition where drugs are now needed not just to get high, but to feel normal. Addiction cannot be cured but it can be stopped. This often requires outside help and the care of professionals. Treatment centers are listed in the yellow pages under: Cocaine, Narcotics, and Alcoholics Anonymous. Most hospitals and clinics can refer you to a specialized care center. Talk to your caregiver if you need help.   This information is not intended to replace advice given to you by your health care provider. Make sure you discuss any questions you have with your health care provider.   Document Released: 11/05/2004 Document Revised: 06/08/2011 Document Reviewed: 03/21/2014 Elsevier Interactive Patient Education [employer]

## 2015-04-24 NOTE — ED Notes (Signed)
Pt left with all his belongings and ambulated out of treatment area.  

## 2015-04-24 NOTE — ED Provider Notes (Signed)
CSN: [id number]     Arrival date & time 04/24/15  4098 History   First MD Initiated Contact with Patient 04/24/15 [phone]     Chief Complaint  Patient presents with  . Drug / Alcohol Assessment     (Consider location/radiation/quality/duration/timing/severity/associated sxs/prior Treatment) HPI  Calvin Newman is a 45yo male, no sig PMH, here with cocaine and alcohol abuse.  Patient states he has been stressed out lately and has not been home to his wife and kids for 2 days.  He has been binging on cocaine and alcohol as well.  He is here requesting medical clearance and has already set up admission to rehab facility.  The mobile crisis unit came out to him and took him to this ED for clearance.  He states he had SI yesterday but not today.  He denies HI.  He denies any medical complaints such as fever, recent infection, vomiting or diarrhea.  He has no other complaints.  10 Systems reviewed and are negative for acute change except as noted in the HPI.    History reviewed. No pertinent past medical history. Past Surgical History  Procedure Laterality Date  . R hand surgery      fracture 4th and 5th metacarpal   Family History  Problem Relation Age of Onset  . Cancer Father    Social History  Substance Use Topics  . Smoking status: Current Every Day Smoker -- 1.00 packs/day    Types: Cigarettes  . Smokeless tobacco: None  . Alcohol Use: Yes     Comment: occasional    Review of Systems    Allergies  Review of patient's allergies indicates no known allergies.  Home Medications   Prior to Admission medications   Not on File   BP 117/81 mmHg  Pulse 71  Temp(Src) 97.4 F (36.3 C) (Oral)  Resp 15  SpO2 99% Physical Exam  Constitutional: He is oriented to person, place, and time. Vital signs are normal. He appears well-developed and well-nourished.  Non-toxic appearance. He does not appear ill. No distress.  HENT:  Head: Normocephalic and atraumatic.  Nose: Nose normal.   Mouth/Throat: Oropharynx is clear and moist. No oropharyngeal exudate.  Eyes: Conjunctivae and EOM are normal. Pupils are equal, round, and reactive to light. No scleral icterus.  Neck: Normal range of motion. Neck supple. No tracheal deviation, no edema, no erythema and normal range of motion present. No thyroid mass and no thyromegaly present.  Cardiovascular: Normal rate, regular rhythm, S1 normal, S2 normal, normal heart sounds, intact distal pulses and normal pulses.  Exam reveals no gallop and no friction rub.   No murmur heard. Pulmonary/Chest: Effort normal and breath sounds normal. No respiratory distress. He has no wheezes. He has no rhonchi. He has no rales.  Abdominal: Soft. Normal appearance and bowel sounds are normal. He exhibits no distension, no ascites and no mass. There is no hepatosplenomegaly. There is no tenderness. There is no rebound, no guarding and no CVA tenderness.  Musculoskeletal: Normal range of motion. He exhibits no edema or tenderness.  Lymphadenopathy:    He has no cervical adenopathy.  Neurological: He is alert and oriented to person, place, and time. He has normal strength. No cranial nerve deficit or sensory deficit.  Skin: Skin is warm, dry and intact. No petechiae and no rash noted. He is not diaphoretic. No erythema. No pallor.  Psychiatric:  Depressed mood  Nursing note and vitals reviewed.   ED Course  Procedures (including critical care  time) Labs Review Labs Reviewed  BASIC METABOLIC PANEL - Abnormal; Notable for the following:    Chloride 97 (*)    Glucose, Bld 104 (*)    Creatinine, Ser 1.35 (*)    Anion gap 16 (*)    All other components within normal limits  URINE RAPID DRUG SCREEN, HOSP PERFORMED - Abnormal; Notable for the following:    Cocaine POSITIVE (*)    Tetrahydrocannabinol POSITIVE (*)    All other components within normal limits  ACETAMINOPHEN LEVEL - Abnormal; Notable for the following:    Acetaminophen (Tylenol), Serum  <10 (*)    All other components within normal limits  CBC WITH DIFFERENTIAL/PLATELET  ETHANOL  SALICYLATE LEVEL    Imaging Review No results found. I have personally reviewed and evaluated these images and lab results as part of my medical decision-making.   EKG Interpretation   Date/Time:  Wednesday April 24 2015 04:17:04 EST Ventricular Rate:  81 PR Interval:  134 QRS Duration: 80 QT Interval:  385 QTC Calculation: 447 R Axis:   66 Text Interpretation:  Sinus rhythm ST elev, probable normal early repol  pattern No significant change since last tracing Confirmed by Erroll Luna [phone]) on 04/24/2015 5:01:41 AM      MDM   Final diagnoses:  Cocaine abuse  Marijuana abuse  Alcohol abuse     Patient presents to the ED for medical clearance to rehab.  He is medically cleared by myself.  Labs were obtained for the rehab facility and they are only significant for cocaine and THC in the urine.  He continues to appear well and is not in any withdrawal.  VS remain within his normal limits and he is safe for DC in the care of the mobile crisis team to take him to a detox facility.     Tomasita Crumble, MD 04/24/15 0530

## 2018-01-08 ENCOUNTER — Encounter (HOSPITAL_COMMUNITY): Payer: Self-pay | Admitting: Emergency Medicine

## 2018-01-08 ENCOUNTER — Emergency Department (HOSPITAL_COMMUNITY)
Admission: EM | Admit: 2018-01-08 | Discharge: 2018-01-09 | Disposition: A | Discharge status: Home / Self Care | Payer: Self-pay | Attending: Emergency Medicine | Admitting: Emergency Medicine

## 2018-01-08 DIAGNOSIS — F329 Major depressive disorder, single episode, unspecified: Secondary | ICD-10-CM

## 2018-01-08 DIAGNOSIS — F1420 Cocaine dependence, uncomplicated: Secondary | ICD-10-CM | POA: Insufficient documentation

## 2018-01-08 DIAGNOSIS — F17210 Nicotine dependence, cigarettes, uncomplicated: Secondary | ICD-10-CM | POA: Insufficient documentation

## 2018-01-08 DIAGNOSIS — F1020 Alcohol dependence, uncomplicated: Secondary | ICD-10-CM | POA: Insufficient documentation

## 2018-01-08 DIAGNOSIS — R45851 Suicidal ideations: Secondary | ICD-10-CM

## 2018-01-08 DIAGNOSIS — F32A Depression, unspecified: Secondary | ICD-10-CM

## 2018-01-08 DIAGNOSIS — F332 Major depressive disorder, recurrent severe without psychotic features: Secondary | ICD-10-CM | POA: Insufficient documentation

## 2018-01-08 DIAGNOSIS — F1910 Other psychoactive substance abuse, uncomplicated: Secondary | ICD-10-CM

## 2018-01-08 LAB — RAPID URINE DRUG SCREEN, HOSP PERFORMED
AMPHETAMINES: NOT DETECTED
BARBITURATES: NOT DETECTED
BENZODIAZEPINES: NOT DETECTED
Cocaine: POSITIVE — AB
Opiates: NOT DETECTED
TETRAHYDROCANNABINOL: POSITIVE — AB

## 2018-01-08 LAB — COMPREHENSIVE METABOLIC PANEL
ALT: 93 U/L — ABNORMAL HIGH (ref 0–44)
ANION GAP: 9 (ref 5–15)
AST: 743 U/L — AB (ref 15–41)
Albumin: 3.3 g/dL — ABNORMAL LOW (ref 3.5–5.0)
Alkaline Phosphatase: 40 U/L (ref 38–126)
BUN: 29 mg/dL — AB (ref 6–20)
CHLORIDE: 103 mmol/L (ref 98–111)
CO2: 23 mmol/L (ref 22–32)
Calcium: 8.4 mg/dL — ABNORMAL LOW (ref 8.9–10.3)
Creatinine, Ser: 1.55 mg/dL — ABNORMAL HIGH (ref 0.61–1.24)
GFR, EST AFRICAN AMERICAN: 59 mL/min — AB (ref >60)
GFR, EST NON AFRICAN AMERICAN: 51 mL/min — AB (ref >60)
Glucose, Bld: 169 mg/dL — ABNORMAL HIGH (ref 70–99)
POTASSIUM: 3.5 mmol/L (ref 3.5–5.1)
Sodium: 135 mmol/L (ref 135–145)
Total Bilirubin: 1.2 mg/dL (ref 0.3–1.2)
Total Protein: 6.5 g/dL (ref 6.5–8.1)

## 2018-01-08 LAB — CBC
HEMATOCRIT: 43.4 % (ref 39.0–52.0)
HEMOGLOBIN: 14.7 g/dL (ref 13.0–17.0)
MCH: 29.3 pg (ref 26.0–34.0)
MCHC: 33.9 g/dL (ref 30.0–36.0)
MCV: 86.6 fL (ref 80.0–100.0)
NRBC: 0 % (ref 0.0–0.2)
Platelets: 299 10*3/uL (ref 150–400)
RBC: 5.01 MIL/uL (ref 4.22–5.81)
RDW: 13.8 % (ref 11.5–15.5)
WBC: 7.2 10*3/uL (ref 4.0–10.5)

## 2018-01-08 LAB — ETHANOL

## 2018-01-08 LAB — SALICYLATE LEVEL

## 2018-01-08 LAB — ACETAMINOPHEN LEVEL

## 2018-01-08 NOTE — ED Triage Notes (Signed)
Pt reports he wants to "take my life b/c I should not be on this earth."  Pt reports many family issues "I've lost everything", he relapsed on using cocaine after he "messed everything up, life was good but it has all fallen apart."  I don't have anything left."  Pt tearful in triage

## 2018-01-09 ENCOUNTER — Other Ambulatory Visit: Payer: Self-pay

## 2018-01-09 ENCOUNTER — Encounter (HOSPITAL_COMMUNITY): Payer: Self-pay

## 2018-01-09 ENCOUNTER — Inpatient Hospital Stay (HOSPITAL_COMMUNITY)
Admission: AD | Admit: 2018-01-09 | Discharge: 2018-01-09 | DRG: 885 | Disposition: A | Discharge status: Other Acute Inpatient Hospital | Payer: Self-pay | Source: Intra-hospital | Attending: Family Medicine | Admitting: Family Medicine

## 2018-01-09 ENCOUNTER — Inpatient Hospital Stay (HOSPITAL_COMMUNITY)
Admission: AD | Admit: 2018-01-09 | Discharge: 2018-01-13 | DRG: 683 | Disposition: A | Discharge status: Home / Self Care | Payer: Self-pay | Source: Other Acute Inpatient Hospital | Attending: Family Medicine | Admitting: Family Medicine

## 2018-01-09 DIAGNOSIS — Z23 Encounter for immunization: Secondary | ICD-10-CM

## 2018-01-09 DIAGNOSIS — F32A Depression, unspecified: Secondary | ICD-10-CM

## 2018-01-09 DIAGNOSIS — F1410 Cocaine abuse, uncomplicated: Secondary | ICD-10-CM

## 2018-01-09 DIAGNOSIS — M6282 Rhabdomyolysis: Secondary | ICD-10-CM | POA: Diagnosis present

## 2018-01-09 DIAGNOSIS — Z590 Homelessness: Secondary | ICD-10-CM

## 2018-01-09 DIAGNOSIS — F419 Anxiety disorder, unspecified: Secondary | ICD-10-CM | POA: Diagnosis present

## 2018-01-09 DIAGNOSIS — N289 Disorder of kidney and ureter, unspecified: Secondary | ICD-10-CM | POA: Diagnosis present

## 2018-01-09 DIAGNOSIS — Z809 Family history of malignant neoplasm, unspecified: Secondary | ICD-10-CM

## 2018-01-09 DIAGNOSIS — N179 Acute kidney failure, unspecified: Principal | ICD-10-CM | POA: Diagnosis present

## 2018-01-09 DIAGNOSIS — R030 Elevated blood-pressure reading, without diagnosis of hypertension: Secondary | ICD-10-CM | POA: Diagnosis present

## 2018-01-09 DIAGNOSIS — R748 Abnormal levels of other serum enzymes: Secondary | ICD-10-CM

## 2018-01-09 DIAGNOSIS — F332 Major depressive disorder, recurrent severe without psychotic features: Principal | ICD-10-CM | POA: Diagnosis present

## 2018-01-09 DIAGNOSIS — F17210 Nicotine dependence, cigarettes, uncomplicated: Secondary | ICD-10-CM | POA: Diagnosis present

## 2018-01-09 DIAGNOSIS — R45851 Suicidal ideations: Secondary | ICD-10-CM | POA: Diagnosis present

## 2018-01-09 DIAGNOSIS — E876 Hypokalemia: Secondary | ICD-10-CM | POA: Diagnosis present

## 2018-01-09 DIAGNOSIS — F322 Major depressive disorder, single episode, severe without psychotic features: Secondary | ICD-10-CM | POA: Diagnosis present

## 2018-01-09 DIAGNOSIS — F329 Major depressive disorder, single episode, unspecified: Secondary | ICD-10-CM | POA: Diagnosis present

## 2018-01-09 DIAGNOSIS — G4700 Insomnia, unspecified: Secondary | ICD-10-CM | POA: Diagnosis present

## 2018-01-09 LAB — URINALYSIS, ROUTINE W REFLEX MICROSCOPIC
BACTERIA UA: NONE SEEN
BILIRUBIN URINE: NEGATIVE
Glucose, UA: NEGATIVE mg/dL
Ketones, ur: NEGATIVE mg/dL
LEUKOCYTES UA: NEGATIVE
Nitrite: NEGATIVE
Protein, ur: NEGATIVE mg/dL
SPECIFIC GRAVITY, URINE: 1.014 (ref 1.005–1.030)
pH: 5 (ref 5.0–8.0)

## 2018-01-09 LAB — COMPREHENSIVE METABOLIC PANEL
ALBUMIN: 3.1 g/dL — AB (ref 3.5–5.0)
ALT: 113 U/L — AB (ref 0–44)
AST: 868 U/L — AB (ref 15–41)
Alkaline Phosphatase: 37 U/L — ABNORMAL LOW (ref 38–126)
Anion gap: 10 (ref 5–15)
BUN: 22 mg/dL — AB (ref 6–20)
CHLORIDE: 104 mmol/L (ref 98–111)
CO2: 26 mmol/L (ref 22–32)
CREATININE: 1.43 mg/dL — AB (ref 0.61–1.24)
Calcium: 8.3 mg/dL — ABNORMAL LOW (ref 8.9–10.3)
GFR calc Af Amer: >60 mL/min (ref >60)
GFR calc non Af Amer: 56 mL/min — ABNORMAL LOW (ref >60)
GLUCOSE: 117 mg/dL — AB (ref 70–99)
POTASSIUM: 3.4 mmol/L — AB (ref 3.5–5.1)
Sodium: 140 mmol/L (ref 135–145)
Total Bilirubin: 0.9 mg/dL (ref 0.3–1.2)
Total Protein: 6 g/dL — ABNORMAL LOW (ref 6.5–8.1)

## 2018-01-09 LAB — PROTIME-INR
INR: 0.84
Prothrombin Time: 11.4 seconds (ref 11.4–15.2)

## 2018-01-09 LAB — RAPID URINE DRUG SCREEN, HOSP PERFORMED
Amphetamines: NOT DETECTED
BENZODIAZEPINES: NOT DETECTED
Barbiturates: NOT DETECTED
COCAINE: POSITIVE — AB
OPIATES: NOT DETECTED
TETRAHYDROCANNABINOL: POSITIVE — AB

## 2018-01-09 LAB — MAGNESIUM: Magnesium: 2.1 mg/dL (ref 1.7–2.4)

## 2018-01-09 LAB — CK: Total CK: >50000 U/L — ABNORMAL HIGH (ref 49–397)

## 2018-01-09 MED ORDER — VITAMIN B-1 100 MG PO TABS
100.0000 mg | ORAL_TABLET | Freq: Every day | ORAL | Status: DC
  Administered 2018-01-09 – 2018-01-13 (×5): 100 mg via ORAL
  Filled 2018-01-09 (×5): qty 1

## 2018-01-09 MED ORDER — TRAZODONE HCL 100 MG PO TABS
100.0000 mg | ORAL_TABLET | Freq: Every evening | ORAL | Status: DC | PRN
  Filled 2018-01-09 (×2): qty 1

## 2018-01-09 MED ORDER — ALUM & MAG HYDROXIDE-SIMETH 200-200-20 MG/5ML PO SUSP: 30.0000 mL | ORAL | Status: DC | PRN

## 2018-01-09 MED ORDER — ADULT MULTIVITAMIN W/MINERALS CH: 1.0000 | ORAL_TABLET | Freq: Every day | ORAL | Status: DC

## 2018-01-09 MED ORDER — SODIUM CHLORIDE 0.9 % IV BOLUS
1000.0000 mL | Freq: Once | INTRAVENOUS | Status: AC
  Administered 2018-01-09: 1000 mL via INTRAVENOUS

## 2018-01-09 MED ORDER — METHOCARBAMOL 500 MG PO TABS
500.0000 mg | ORAL_TABLET | Freq: Three times a day (TID) | ORAL | Status: DC | PRN
  Administered 2018-01-09: 500 mg via ORAL

## 2018-01-09 MED ORDER — PNEUMOCOCCAL VAC POLYVALENT 25 MCG/0.5ML IJ INJ: 0.5000 mL | INJECTION | INTRAMUSCULAR | Status: DC

## 2018-01-09 MED ORDER — SODIUM BICARBONATE 8.4 % IV SOLN
INTRAVENOUS | Status: DC
  Filled 2018-01-09: qty 150

## 2018-01-09 MED ORDER — POTASSIUM CHLORIDE CRYS ER 20 MEQ PO TBCR
40.0000 meq | EXTENDED_RELEASE_TABLET | Freq: Once | ORAL | Status: AC
  Administered 2018-01-09: 40 meq via ORAL
  Filled 2018-01-09: qty 2

## 2018-01-09 MED ORDER — ACETAMINOPHEN 325 MG PO TABS: 650.0000 mg | ORAL_TABLET | Freq: Four times a day (QID) | ORAL | Status: DC | PRN

## 2018-01-09 MED ORDER — OXYCODONE HCL 5 MG PO TABS
5.0000 mg | ORAL_TABLET | ORAL | Status: DC | PRN
  Administered 2018-01-09 – 2018-01-13 (×6): 5 mg via ORAL
  Filled 2018-01-09 (×6): qty 1

## 2018-01-09 MED ORDER — INFLUENZA VAC SPLIT QUAD 0.5 ML IM SUSY
0.5000 mL | PREFILLED_SYRINGE | INTRAMUSCULAR | Status: AC
  Administered 2018-01-11: 0.5 mL via INTRAMUSCULAR
  Filled 2018-01-09: qty 0.5

## 2018-01-09 MED ORDER — CLONIDINE HCL 0.1 MG PO TABS: 0.1000 mg | ORAL_TABLET | Freq: Every day | ORAL | Status: DC

## 2018-01-09 MED ORDER — NAPROXEN 500 MG PO TABS
500.0000 mg | ORAL_TABLET | Freq: Two times a day (BID) | ORAL | Status: DC | PRN
  Administered 2018-01-09: 500 mg via ORAL

## 2018-01-09 MED ORDER — SODIUM CHLORIDE 0.9 % IV SOLN
INTRAVENOUS | Status: DC
  Administered 2018-01-09 – 2018-01-10 (×4): via INTRAVENOUS

## 2018-01-09 MED ORDER — NICOTINE POLACRILEX 2 MG MT GUM: 2.0000 mg | CHEWING_GUM | OROMUCOSAL | Status: DC | PRN

## 2018-01-09 MED ORDER — CLONIDINE HCL 0.1 MG PO TABS: 0.1000 mg | ORAL_TABLET | ORAL | Status: DC

## 2018-01-09 MED ORDER — FOLIC ACID 1 MG PO TABS: 1.0000 mg | ORAL_TABLET | Freq: Every day | ORAL | Status: DC

## 2018-01-09 MED ORDER — SODIUM CHLORIDE 0.9 % IV SOLN: INTRAVENOUS | Status: DC

## 2018-01-09 MED ORDER — ADULT MULTIVITAMIN W/MINERALS CH
1.0000 | ORAL_TABLET | Freq: Every day | ORAL | Status: DC
  Administered 2018-01-09 – 2018-01-13 (×5): 1 via ORAL
  Filled 2018-01-09 (×5): qty 1

## 2018-01-09 MED ORDER — NICOTINE 21 MG/24HR TD PT24
21.0000 mg | MEDICATED_PATCH | Freq: Every day | TRANSDERMAL | Status: DC
  Administered 2018-01-09: 21 mg via TRANSDERMAL
  Filled 2018-01-09 (×3): qty 1

## 2018-01-09 MED ORDER — LOPERAMIDE HCL 2 MG PO CAPS: 2.0000 mg | ORAL_CAPSULE | ORAL | Status: DC | PRN

## 2018-01-09 MED ORDER — NICOTINE 21 MG/24HR TD PT24: 21.0000 mg | MEDICATED_PATCH | Freq: Every day | TRANSDERMAL | Status: DC

## 2018-01-09 MED ORDER — LIDOCAINE 5 % EX PTCH: 1.0000 | MEDICATED_PATCH | CUTANEOUS | Status: DC

## 2018-01-09 MED ORDER — ENOXAPARIN SODIUM 30 MG/0.3ML ~~LOC~~ SOLN: 30.0000 mg | SUBCUTANEOUS | Status: DC

## 2018-01-09 MED ORDER — ENOXAPARIN SODIUM 40 MG/0.4ML ~~LOC~~ SOLN
40.0000 mg | SUBCUTANEOUS | Status: DC
  Administered 2018-01-09 – 2018-01-11 (×3): 40 mg via SUBCUTANEOUS
  Filled 2018-01-09 (×4): qty 0.4

## 2018-01-09 MED ORDER — POLYETHYLENE GLYCOL 3350 17 G PO PACK: 17.0000 g | PACK | Freq: Every day | ORAL | Status: DC

## 2018-01-09 MED ORDER — OXYCODONE HCL 5 MG PO TABS: 5.0000 mg | ORAL_TABLET | ORAL | Status: DC | PRN

## 2018-01-09 MED ORDER — ONDANSETRON 4 MG PO TBDP: 4.0000 mg | ORAL_TABLET | Freq: Four times a day (QID) | ORAL | Status: DC | PRN

## 2018-01-09 MED ORDER — DICYCLOMINE HCL 20 MG PO TABS: 20.0000 mg | ORAL_TABLET | Freq: Four times a day (QID) | ORAL | Status: DC | PRN

## 2018-01-09 MED ORDER — FOLIC ACID 1 MG PO TABS
1.0000 mg | ORAL_TABLET | Freq: Every day | ORAL | Status: DC
  Administered 2018-01-09 – 2018-01-13 (×5): 1 mg via ORAL
  Filled 2018-01-09 (×5): qty 1

## 2018-01-09 MED ORDER — VITAMIN B-1 100 MG PO TABS: 100.0000 mg | ORAL_TABLET | Freq: Every day | ORAL | Status: DC

## 2018-01-09 MED ORDER — POLYETHYLENE GLYCOL 3350 17 G PO PACK
17.0000 g | PACK | Freq: Every day | ORAL | Status: DC
  Filled 2018-01-09 (×3): qty 1

## 2018-01-09 MED ORDER — METHOCARBAMOL 500 MG PO TABS
ORAL_TABLET | ORAL | Status: AC
  Filled 2018-01-09: qty 1

## 2018-01-09 MED ORDER — MAGNESIUM HYDROXIDE 400 MG/5ML PO SUSP: 30.0000 mL | Freq: Every day | ORAL | Status: DC | PRN

## 2018-01-09 MED ORDER — CLONIDINE HCL 0.1 MG PO TABS
0.1000 mg | ORAL_TABLET | Freq: Four times a day (QID) | ORAL | Status: DC
  Filled 2018-01-09 (×6): qty 1

## 2018-01-09 MED ORDER — NAPROXEN 500 MG PO TABS
ORAL_TABLET | ORAL | Status: AC
  Filled 2018-01-09: qty 1

## 2018-01-09 NOTE — Tx Team (Addendum)
Initial Treatment Plan 01/09/2018 4:36 AM Calvin Newman ZOX:[id number]    PATIENT STRESSORS: Financial difficulties Marital or family conflict Substance abuse   PATIENT STRENGTHS: Ability for insight Average or above average intelligence Capable of independent living Motivation for treatment/growth Supportive family/friends   PATIENT IDENTIFIED PROBLEMS: Substance abuse  Depression    "Get my mind back right.               DISCHARGE CRITERIA:  Improved stabilization in mood, thinking, and/or behavior  PRELIMINARY DISCHARGE PLAN: Return to previous living arrangement  PATIENT/FAMILY INVOLVEMENT: This treatment plan has been presented to and reviewed with the patient, Calvin Newman, and/or family member.  The patient and family have been given the opportunity to ask questions and make suggestions.  Floyce Stakes, RN 01/09/2018, 4:36 AM

## 2018-01-09 NOTE — [facility] Group Notes (Signed)
[facility] LCSW Group Therapy Note  Date/Time:  01/09/2018 9:00-10:00 or 10:00-11:00AM  Type of Therapy and Topic:  Group Therapy:  Healthy and Unhealthy Supports  Participation Level:  Did Not Attend   Description of Group:  Patients in this group were introduced to the idea of adding a variety of healthy supports to address the various needs in their lives.Patients discussed what additional healthy supports could be helpful in their recovery and wellness after discharge in order to prevent future hospitalizations.   An emphasis was placed on using counselor, doctor, therapy groups, 12-step groups, and problem-specific support groups to expand supports.  They also worked as a group on developing a specific plan for several patients to deal with unhealthy supports through boundary-setting, psychoeducation with loved ones, and even termination of relationships.   Therapeutic Goals:   1)  discuss importance of adding supports to stay well once out of the hospital  2)  compare healthy versus unhealthy supports and identify some examples of each  3)  generate ideas and descriptions of healthy supports that can be added  4)  offer mutual support about how to address unhealthy supports  5)  encourage active participation in and adherence to discharge plan    Summary of Patient Progress:   Did not attend  Therapeutic Modalities:   Motivational Interviewing Brief Solution-Focused Therapy  Jamillia Closson D Freddi Forster         
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
## 2018-01-09 NOTE — H&P (Signed)
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 MDD (major depressive disorder), severe ([place]) [F32.2] 01/09/2018  . CHLAMYDIAL INFECTION [A56.01] 01/09/2009  . TOBACCO ABUSE [F17.200] 01/08/2009  . HEMOCCULT POSITIVE STOOL [K92.1] 01/08/2009  . ACID REFLUX DISEASE [K21.9] 11/26/2008   History of Present Illness: Patient is seen and examined.  Patient is a 47 year old male with a past psychiatric history significant for cocaine dependence who presented to the [facility] emergency department on 10/13 with suicidal ideation.  Patient stated that he had been clean of substances for 1 year, but on his birthday a friend offered him some cocaine.  He then relapsed.  He stated that after that he is lost everything.  He stated that his family is very upset with him, and that right now he is basically homeless.  He admitted to walking around town for the last 4 days.  He admitted to significant musculoskeletal pain.  He admitted to crying spells, social withdrawal, loss of interest in usual pleasures, fatigue, decreased concentration, decreased sleep, decreased appetite and feelings of guilt and hopelessness.  He stated that he had been sober for 1 year.  He had been in a substance rehabilitation program prior to that.  He denied any recent alcohol or opiates.  Review of his laboratories showed a significant elevation his liver function enzymes (AST of 724), and is well an increase in his renal disease (previous creatinine 1.2-1.3, now 1.55).  As stated above he complained of significant musculoskeletal pain.  On examination there is concern for possible  rhabdomyolysis.  He was admitted to the hospital for evaluation and stabilization.  Associated Signs/Symptoms: Depression Symptoms:  depressed mood, anhedonia, insomnia, psychomotor agitation, fatigue, feelings of worthlessness/guilt, difficulty concentrating, hopelessness, suicidal thoughts without plan, anxiety, loss of energy/fatigue, disturbed sleep, (Hypo) Manic Symptoms:  Impulsivity, Irritable Mood, Anxiety Symptoms:  Excessive Worry, Psychotic Symptoms:  Denied PTSD Symptoms: Negative Total Time spent with patient: 30 minutes  Past Psychiatric History: Patient has a multiyear history of substance abuse including alcohol, cocaine and alcohol.  He stated been sober for approximately a year.  He had an inpatient rehabilitation stay in December 2018.  Is the patient at risk to self? Yes.    Has the patient been a risk to self in the past 6 months? Yes.    Has the patient been a risk to self within the distant past? No.  Is the patient a risk to others? No.  Has the patient been a risk to others in the past 6 months? No.  Has the patient been a risk to others within the distant past? No.   Prior Inpatient Therapy:   Prior Outpatient Therapy:    Alcohol Screening: Patient refused Alcohol Screening Tool: Yes 1. How often do you have a drink containing alcohol?: 2 to 4 times a month 2. How many drinks containing alcohol do you have on a typical day when you are drinking?: 3 or 4 3. How often do you have six or more drinks on one occasion?: Less than monthly AUDIT-C Score: 4 4. How often during the last year have you found that you were not able to stop drinking once you had started?: Never 5. How often during the  last year have you failed to do what was normally expected from you becasue of drinking?: Never 6. How often during the last year have you needed a first drink in the morning to get yourself going after a heavy drinking session?: Never 7. How often during the last  year have you had a feeling of guilt of remorse after drinking?: Never 8. How often during the last year have you been unable to remember what happened the night before because you had been drinking?: Never 9. Have you or someone else been injured as a result of your drinking?: No 10. Has a relative or friend or a doctor or another health worker been concerned about your drinking or suggested you cut down?: No Alcohol Use Disorder Identification Test Final Score (AUDIT): 4 Intervention/Follow-up: Patient Refused, AUDIT Score <7 follow-up not indicated Substance Abuse History in the last 12 months:  Yes.   Consequences of Substance Abuse: Family Consequences:  Family will not let him return to the home because of this most recent relapse Withdrawal Symptoms:   Cramps Diaphoresis Diarrhea Headaches Nausea Tremors Previous Psychotropic Medications: Yes  Psychological Evaluations: Yes  Past Medical History: History reviewed. No pertinent past medical history. Past Surgical History:  Procedure Laterality Date  . r hand surgery     fracture 4th and 5th metacarpal   Family History:  Family History  Problem Relation Age of Onset  . Cancer Father    Family Psychiatric  History: Noncontributory Tobacco Screening: Have you used any form of tobacco in the last 30 days? (Cigarettes, Smokeless Tobacco, Cigars, and/or Pipes): Yes Tobacco use, Select all that apply: 5 or more cigarettes per day Are you interested in Tobacco Cessation Medications?: No, patient refused Counseled patient on smoking cessation including recognizing danger situations, developing coping skills and basic information about quitting provided: Refused/Declined practical counseling Social History:  Social History   Substance and Sexual Activity  Alcohol Use Yes   Comment: occasional    Social History   Substance and Sexual Activity  Drug Use Yes  . Types: Cocaine    Additional Social History: Marital status:  Married Number of Years Married: 22 What types of issues is patient dealing with in the relationship?: Together 20 years, issues due to Rachel. She and my son use marijuana but they judge me and are not super supportive with my addiction they say it's different.  Are you sexually active?: Yes What is your sexual orientation?: straight Has your sexual activity been affected by drugs, alcohol, medication, or emotional stress?: Denies Does patient have children?: Yes How many children?: 4 How is patient's relationship with their children?: 71, 59, 101 and 45 year old children.                          Allergies:  No Known Allergies Lab Results:  Results for orders placed or performed during the hospital encounter of 01/09/18 (from the past 48 hour(s))  Comprehensive metabolic panel     Status: Abnormal   Collection Time: 01/09/18 10:04 AM  Result Value Ref Range   Sodium 140 135 - 145 mmol/L   Potassium 3.4 (L) 3.5 - 5.1 mmol/L   Chloride 104 98 - 111 mmol/L   CO2 26 22 - 32 mmol/L   Glucose, Bld 117 (H) 70 - 99 mg/dL   BUN 22 (H) 6 - 20 mg/dL   Creatinine, Ser 1.43 (H) 0.61 - 1.24 mg/dL   Calcium 8.3 (L) 8.9 -  10.3 mg/dL   Total Protein 6.0 (L) 6.5 - 8.1 g/dL   Albumin 3.1 (L) 3.5 - 5.0 g/dL   AST 868 (H) 15 - 41 U/L   ALT 113 (H) 0 - 44 U/L   Alkaline Phosphatase 37 (L) 38 - 126 U/L   Total Bilirubin 0.9 0.3 - 1.2 mg/dL   GFR calc non Af Amer 56 (L) >60 mL/min   GFR calc Af Amer >60 >60 mL/min    Comment: (NOTE) The eGFR has been calculated using the CKD EPI equation. This calculation has not been validated in all clinical situations. eGFR's persistently <60 mL/min signify possible Chronic Kidney Disease.    Anion gap 10 5 - 15    Comment: Performed at [facility], [place] [address], [place], [place] [address]  CK     Status: Abnormal   Collection Time: 01/09/18 10:04 AM  Result Value Ref Range   Total CK >50,000 (H) 49 - 397 U/L    Comment: RESULTS  CONFIRMED BY MANUAL DILUTION Performed at [facility], [place] [address], [place], [place] [address]   Protime-INR     Status: None   Collection Time: 01/09/18 10:04 AM  Result Value Ref Range   Prothrombin Time 11.4 11.4 - 15.2 seconds   INR 0.84     Comment: Performed at [facility], [place] [address], [place], [place] [address]    Blood Alcohol level:  Lab Results  Component Value Date   ETH <10 01/08/2018   ETH <5 32/99/2426    Metabolic Disorder Labs:  No results found for: HGBA1C, MPG No results found for: PROLACTIN No results found for: CHOL, TRIG, HDL, CHOLHDL, VLDL, LDLCALC  Current Medications: Current Facility-Administered Medications  Medication Dose Route Frequency Provider Last Rate Last Dose  . alum & mag hydroxide-simeth (MAALOX/MYLANTA) 200-200-20 MG/5ML suspension 30 mL  30 mL Oral Q4H PRN Patriciaann Clan E, PA-C      . loperamide (IMODIUM) capsule 2-4 mg  2-4 mg Oral PRN Patriciaann Clan E, PA-C      . magnesium hydroxide (MILK OF MAGNESIA) suspension 30 mL  30 mL Oral Daily PRN Laverle Hobby, PA-C      . methocarbamol (ROBAXIN) tablet 500 mg  500 mg Oral Q8H PRN Patriciaann Clan E, PA-C   500 mg at 01/09/18 0402  . naproxen (NAPROSYN) tablet 500 mg  500 mg Oral BID PRN Patriciaann Clan E, PA-C   500 mg at 01/09/18 0402  . nicotine (NICODERM CQ - dosed in mg/24 hours) patch 21 mg  21 mg Transdermal Daily Sharma Covert, MD   21 mg at 01/09/18 0923  . ondansetron (ZOFRAN-ODT) disintegrating tablet 4 mg  4 mg Oral Q6H PRN Laverle Hobby, PA-C      . [START ON 01/10/2018] pneumococcal 23 valent vaccine (PNU-IMMUNE) injection 0.5 mL  0.5 mL Intramuscular Tomorrow-1000 Simon, Spencer E, PA-C      . sodium bicarbonate 150 mEq in dextrose 5 % 1,000 mL infusion   Intravenous Continuous Davonna Belling, MD      . sodium chloride 0.9 % bolus 1,000 mL  1,000 mL Intravenous Once Davonna Belling, MD 983.6 mL/hr at 01/09/18 1334 1,000 mL  at 01/09/18 1334  . traZODone (DESYREL) tablet 100 mg  100 mg Oral QHS,MR X 1 Simon, Spencer E, PA-C       No current outpatient medications on file.   PTA Medications:  (Not in a hospital admission)  Musculoskeletal: Strength &  Muscle Tone: abnormal Gait & Station: broad based Patient leans: N/A  Psychiatric Specialty Exam: Physical Exam  Nursing note and vitals reviewed. Constitutional: He is oriented to person, place, and time. He appears well-developed and well-nourished.  HENT:  Head: Normocephalic and atraumatic.  Respiratory: Effort normal.  Neurological: He is alert and oriented to person, place, and time.    ROS  Blood pressure 124/76, pulse 61, temperature 97.7 F (36.5 C), temperature source Oral, resp. rate 15, height '5\' 11"'  (1.803 m), weight 79.8 kg, SpO2 99 %.Body mass index is 24.55 kg/m.  General Appearance: Disheveled  Eye Contact:  Fair  Speech:  Normal Rate  Volume:  Decreased  Mood:  Depressed  Affect:  Congruent  Thought Process:  Coherent and Descriptions of Associations: Intact  Orientation:  Full (Time, Place, and Person)  Thought Content:  Logical  Suicidal Thoughts:  Yes.  without intent/plan  Homicidal Thoughts:  No  Memory:  Immediate;   Fair Recent;   Fair Remote;   Fair  Judgement:  Intact  Insight:  Fair  Psychomotor Activity:  Increased  Concentration:  Concentration: Fair and Attention Span: Fair  Recall:  [employer] of Knowledge:  Fair  Language:  Fair  Akathisia:  Negative  Handed:  Right  AIMS (if indicated):     Assets:  Desire for Improvement Resilience  ADL's:  Intact  Cognition:  WNL  Sleep:  Number of Hours: 1.25    Treatment Plan Summary: Daily contact with patient to assess and evaluate symptoms and progress in treatment, Medication management and Plan : Patient is seen and examined.  Patient is a 47 year old male with the above-stated past psychiatric history who presented for admission secondary to relapsing on  cocaine, suicidal ideation depressive symptoms.  He was admitted to the psychiatric unit.  Unfortunately his liver function enzymes are significantly elevated with an AST of 724, his creatinine is elevated at 1.55, and he complains of significant musculoskeletal pain.  Staff and I discussed the possibility of getting urine myoglobin, hepatitis panel, repeat liver function enzymes and repeat chemistries, but given the significance of the elevation in his liver function enzymes it was felt best to send him back to the medical side of the hospital and get these laboratories done.  Once he is cleared in the emergency department he will most likely return, and we will press forward with current treatment.  Observation Level/Precautions:  Detox 15 minute checks  Laboratory:  Chemistry Profile  Psychotherapy:    Medications:    Consultations:    Discharge Concerns:    Estimated LOS:  Other:     Physician Treatment Plan for Primary Diagnosis: <principal problem not specified> Long Term Goal(s): Improvement in symptoms so as ready for discharge  Short Term Goals: Ability to identify changes in lifestyle to reduce recurrence of condition will improve, Ability to verbalize feelings will improve, Ability to disclose and discuss suicidal ideas, Ability to demonstrate self-control will improve, Ability to identify and develop effective coping behaviors will improve, Ability to maintain clinical measurements within normal limits will improve and Ability to identify triggers associated with substance abuse/mental health issues will improve  Physician Treatment Plan for Secondary Diagnosis: Active Problems:   MDD (major depressive disorder), severe ([place])  Long Term Goal(s): Improvement in symptoms so as ready for discharge  Short Term Goals: Ability to identify changes in lifestyle to reduce recurrence of condition will improve, Ability to verbalize feelings will improve, Ability to disclose and discuss  suicidal ideas,  Ability to demonstrate self-control will improve, Ability to identify and develop effective coping behaviors will improve, Ability to maintain clinical measurements within normal limits will improve and Ability to identify triggers associated with substance abuse/mental health issues will improve  I certify that inpatient services furnished can reasonably be expected to improve the patient's condition.    Sharma Covert, MD 10/13/20192:30 PM

## 2018-01-09 NOTE — ED Notes (Signed)
ED TO INPATIENT HANDOFF REPORT  Name/Age/Gender Calvin Newman 47 y.o. 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 use disorder severe  Level of Care/Admitting Diagnosis ED Disposition    ED Disposition Condition [facility] Area: [facility] [100102]  Level of Care: Telemetry [5]  Admit to tele based on following criteria: Other see comments  Comments: rhabdo  Diagnosis: Rhabdomyolysis [728.88.ICD-9-CM]  Admitting Physician: Calvin Newman [[id number]]  Attending Physician: Calvin Newman, A CALDWELL [phone]  Estimated length of stay: past midnight tomorrow  Certification:: I certify this patient will need inpatient services for at least 2 midnights  PT Class (Do Not Modify): Inpatient [101]  PT Acc Code (Do Not Modify): Private [1]       Medical History History reviewed. No pertinent past medical history.  Allergies No Known Allergies  IV Location/Drains/Wounds Patient Lines/Drains/Airways Status   Active Line/Drains/Airways    Name:   Placement date:   Placement time:   Site:   Days:   Peripheral IV 01/09/18 Left;Upper Arm   01/09/18    1040    Arm   less than 1          Labs/Imaging Results for orders placed or performed during the hospital encounter of 01/09/18 (from the past 48 hour(s))  Comprehensive metabolic panel     Status: Abnormal   Collection Time: 01/09/18 10:04 AM  Result Value Ref Range   Sodium 140 135 - 145 mmol/L   Potassium 3.4 (L) 3.5 - 5.1 mmol/L   Chloride 104 98 - 111 mmol/L   CO2 26 22 - 32 mmol/L   Glucose, Bld 117 (H) 70 - 99 mg/dL   BUN 22 (H) 6 - 20 mg/dL   Creatinine, Ser 1.43 (H) 0.61 - 1.24 mg/dL   Calcium 8.3 (L) 8.9 - 10.3 mg/dL   Total Protein 6.0 (L) 6.5 - 8.1 g/dL   Albumin 3.1 (L) 3.5 - 5.0 g/dL   AST 868 (H) 15 - 41 U/L   ALT 113 (H) 0 - 44 U/L   Alkaline Phosphatase 37 (L) 38 - 126 U/L   Total Bilirubin 0.9 0.3 - 1.2 mg/dL   GFR calc non Af Amer 56 (L) >60 mL/min   GFR calc Af Amer >60 >60 mL/min    Comment: (NOTE) The eGFR has been calculated using the CKD EPI equation. This calculation has not been validated in all clinical situations. eGFR's persistently <60 mL/min signify possible Chronic Kidney Disease.    Anion gap 10 5 - 15    Comment: Performed at [facility], [place] [address], [place], [place] [address]  [place]     Status: Abnormal   Collection Time: 01/09/18 10:04 AM  Result Value Ref Range   Total CK >50,000 (H) 49 - 397 U/L    Comment: RESULTS CONFIRMED BY MANUAL DILUTION Performed at [facility], [place] [address] [place],  [place] [address]   Protime-INR     Status: None   Collection Time: 01/09/18 10:04 AM  Result Value Ref Range   Prothrombin Time 11.4 11.4 - 15.2 seconds   INR 0.84     Comment: Performed at [facility], [place] [address], [place], [place] [address]   No results found. None  Pending Labs Unresulted Labs (From admission, onward)    Start     Ordered   01/09/18 1443  Magnesium  Add-on,   R     01/09/18 1442   01/09/18 1406  Urinalysis, Routine w reflex microscopic  Once,   R     01/09/18 1405   01/09/18 1004  Hepatitis panel, acute  STAT,   STAT     01/09/18 1004   01/09/18 0910  Lactic acid, plasma  STAT Now then every 3 hours,   R     01/09/18 0909   01/09/18 0812  Myoglobin, urine  Once,   R     01/09/18 8295   Signed and Held  HIV antibody (Routine Testing)  Once,   R     Signed and Held   Signed and Held  CBC  (enoxaparin (LOVENOX)     CrCl < 30 ml/min)  Once,   R    Comments:  Baseline for enoxaparin therapy IF NOT ALREADY DRAWN.  Notify MD if PLT < 100 K.    Signed and Held   Signed and Held  Creatinine, serum  (enoxaparin (LOVENOX)    CrCl < 30 ml/min)  Once,   R    Comments:  Baseline for enoxaparin therapy IF NOT ALREADY DRAWN.    Signed and Held   Signed and Held  Creatinine, serum  (enoxaparin (LOVENOX)    CrCl < 30 ml/min)  Weekly,   R    Comments:  while on enoxaparin therapy.    Signed and Held   Signed and Held  Comprehensive metabolic panel  Tomorrow morning,   R     Signed and Held   Signed and Held  CBC  Tomorrow morning,   R     Signed and Held   Signed and Held  CK  Tomorrow morning,   R     Signed and Held          Vitals/Pain Today's Vitals   01/09/18 0500 01/09/18 0810 01/09/18 1005 01/09/18 1037  BP:  119/83 131/82 124/76  Pulse:  95 66 61  Resp:   16 15  Temp:  98.4 F (36.9 C) 97.7 F (36.5 C)   TempSrc:  Oral Oral   SpO2:   99% 99%  Weight:   79.8 kg   Height:   '5\' 11"'  (1.803 m)   PainSc: Asleep       Isolation Precautions No active isolations  Medications Medications  alum & mag hydroxide-simeth (MAALOX/MYLANTA) 200-200-20 MG/5ML suspension 30 mL (has no administration in time range)  magnesium hydroxide (MILK OF MAGNESIA) suspension 30 mL (has no administration in time range)  traZODone (DESYREL) tablet 100 mg (has no administration in time range)  loperamide (IMODIUM) capsule 2-4 mg (has no administration in time range)  methocarbamol (ROBAXIN) tablet 500 mg (500 mg Oral Given 01/09/18 0402)  ondansetron (ZOFRAN-ODT) disintegrating tablet 4 mg (has no administration in time range)  pneumococcal 23 valent vaccine (PNU-IMMUNE) injection 0.5 mL (has no administration in time range)  nicotine (NICODERM CQ - dosed in mg/24 hours) patch 21 mg (21 mg Transdermal Patch Applied  01/09/18 0923)  sodium chloride 0.9 % bolus 1,000 mL (1,000 mLs Intravenous New Bag/Given 01/09/18  1447)  0.9 %  sodium chloride infusion (has no administration in time range)  naproxen (NAPROSYN) 500 MG tablet (  Duplicate 22/97/98 9211)  methocarbamol (ROBAXIN) 500 MG tablet (0 mg  Duplicate 94/17/40 8144)  sodium chloride 0.9 % bolus 1,000 mL (0 mLs Intravenous Stopped 01/09/18 1440)  potassium chloride SA (K-DUR,KLOR-CON) CR tablet 40 mEq (40 mEq Oral Given 01/09/18 1458)    Mobility walks

## 2018-01-09 NOTE — ED Notes (Signed)
Pt from [facility] with c/o elevated liver enzymes per RN at [facility]

## 2018-01-09 NOTE — Discharge Instructions (Addendum)
Transfer to [facility] for admission

## 2018-01-09 NOTE — Progress Notes (Signed)
Patient admitted voluntarily reporting that he had been sober for a year and recently relapsed on his birthday. He reports that he has been walking for 4 days. He reports that he remembers seeing someone running out his back door when he came from work. He is uncertain if they were male or male. He reports that he needs to get his mind back right.   Patient was given snacks, admission completed and oriented to unit.

## 2018-01-09 NOTE — ED Provider Notes (Signed)
[facility], [place] EMERGENCY DEPARTMENT Provider Note   CSN: [id number] Arrival date & time: 01/08/18  1916     History   Chief Complaint Chief Complaint  Patient presents with  . Suicidal    HPI Calvin Newman is a 47 y.o. male.  Patient presents for help with depression and passive suicidal thoughts. No plan or self harm. He was clean and sober from alcohol for the past year and relapsed last week. He feels he has lost everything. He states he has been walking around for the past 4 days and has come to the conclusion that he doesn't want to be here anymore. No HI/AVH.  The history is provided by the patient. No language interpreter was used.    History reviewed. No pertinent past medical history.  Patient Active Problem List   Diagnosis Date Noted  . CHLAMYDIAL INFECTION 01/09/2009  . TOBACCO ABUSE 01/08/2009  . HEMOCCULT POSITIVE STOOL 01/08/2009  . ACID REFLUX DISEASE 11/26/2008    Past Surgical History:  Procedure Laterality Date  . r hand surgery     fracture 4th and 5th metacarpal        Home Medications    Prior to Admission medications   Not on File    Family History Family History  Problem Relation Age of Onset  . Cancer Father     Social History Social History   Tobacco Use  . Smoking status: Current Every Day Smoker    Packs/day: 1.00    Types: Cigarettes  Substance Use Topics  . Alcohol use: Yes    Comment: occasional  . Drug use: Yes    Types: Cocaine     Allergies   Patient has no known allergies.   Review of Systems Review of Systems  Constitutional: Negative for chills and fever.  HENT: Negative.   Respiratory: Negative.   Cardiovascular: Negative.   Gastrointestinal: Negative.   Musculoskeletal: Negative.   Skin: Negative.   Neurological: Negative.   Psychiatric/Behavioral: Positive for dysphoric mood and suicidal ideas. Negative for hallucinations and self-injury.     Physical Exam Updated  Vital Signs BP 135/85 (BP Location: Right Arm)   Pulse 96   Temp 99.2 F (37.3 C) (Oral)   Resp 16   Ht 5\' 11"  (1.803 m)   Wt 79.8 kg   SpO2 97%   BMI 24.55 kg/m   Physical Exam  Constitutional: He is oriented to person, place, and time. He appears well-developed and well-nourished.  HENT:  Head: Normocephalic.  Neck: Normal range of motion. Neck supple.  Cardiovascular: Normal rate and regular rhythm.  No murmur heard. Pulmonary/Chest: Effort normal and breath sounds normal. He has no wheezes. He has no rales.  Abdominal: Soft. Bowel sounds are normal. There is no tenderness. There is no rebound and no guarding.  Musculoskeletal: Normal range of motion.  Neurological: He is alert and oriented to person, place, and time.  Skin: Skin is warm and dry. No rash noted.  Psychiatric: He is not actively hallucinating. He exhibits a depressed mood. He expresses suicidal ideation.     ED Treatments / Results  Labs (all labs ordered are listed, but only abnormal results are displayed) Labs Reviewed  COMPREHENSIVE METABOLIC PANEL - Abnormal; Notable for the following components:      Result Value   Glucose, Bld 169 (*)    BUN 29 (*)    Creatinine, Ser 1.55 (*)    Calcium 8.4 (*)    Albumin 3.3 (*)  AST 743 (*)    ALT 93 (*)    GFR calc non Af Amer 51 (*)    GFR calc Af Amer 59 (*)    All other components within normal limits  ACETAMINOPHEN LEVEL - Abnormal; Notable for the following components:   Acetaminophen (Tylenol), Serum <10 (*)    All other components within normal limits  RAPID URINE DRUG SCREEN, HOSP PERFORMED - Abnormal; Notable for the following components:   Cocaine POSITIVE (*)    Tetrahydrocannabinol POSITIVE (*)    All other components within normal limits  ETHANOL  SALICYLATE LEVEL  CBC    EKG None  Radiology No results found.  Procedures Procedures (including critical care time)  Medications Ordered in ED Medications  sodium chloride 0.9 %  bolus 1,000 mL (has no administration in time range)  nicotine (NICODERM CQ - dosed in mg/24 hours) patch 21 mg (has no administration in time range)     Initial Impression / Assessment and Plan / ED Course  I have reviewed the triage vital signs and the nursing notes.  Pertinent labs & imaging results that were available during my care of the patient were reviewed by me and considered in my medical decision making (see chart for details).     Patient here for help with depression and suicidal thoughts after relapse on cocaine and alcohol x 1 week. No history of withdrawal symptoms/seizures.   Liver transaminases elevated, c/w alcohol abuse. Normal total bilirubin. No icterus, asterixis. This will need follow up in the outpatient setting.   He has mild AKI with Cr 1.55 and BUN 29. IVF's ordered. Likely secondary to dehydration and does not require medical admission.   TTS consultation requested to determine disposition.   TTS consultation performed and patient meets inpatient criteria. Ready bed available at [facility] when IVF"s completed in ED. Can send by [facility]  Final Clinical Impressions(s) / ED Diagnoses   Final diagnoses:  None   1. Polysubstance abuse 2. Depression 3. Suicidal ideation  ED Discharge Orders    None       Elpidio Anis, PA-C 01/09/18 0100    Devoria Albe, MD 01/09/18 [phone]

## 2018-01-09 NOTE — BHH Suicide Risk Assessment (Signed)
[facility] Admission Suicide Risk Assessment   Nursing information obtained from:  Patient Demographic factors:  Male Current Mental Status:  NA Loss Factors:  Financial problems / change in socioeconomic status Historical Factors:  NA Risk Reduction Factors:  Employed, Positive social support  Total Time spent with patient: 30 minutes Principal Problem: <principal problem not specified> Diagnosis:   Patient Active Problem List   Diagnosis Date Noted  . MDD (major depressive disorder), severe (HCC) [F32.2] 01/09/2018  . CHLAMYDIAL INFECTION [A56.01] 01/09/2009  . TOBACCO ABUSE [F17.200] 01/08/2009  . HEMOCCULT POSITIVE STOOL [K92.1] 01/08/2009  . ACID REFLUX DISEASE [K21.9] 11/26/2008   Subjective Data: Patient is seen and examined.  Patient is a 47 year old male with a past psychiatric history significant for cocaine dependence who presented to the [facility] emergency department on 10/13 with suicidal ideation.  Patient stated that he had been clean of substances for 1 year, but on his birthday a friend offered him some cocaine.  He then relapsed.  He stated that after that he is lost everything.  He stated that his family is very upset with him, and that right now he is basically homeless.  He admitted to walking around town for the last 4 days.  He admitted to significant musculoskeletal pain.  He admitted to crying spells, social withdrawal, loss of interest in usual pleasures, fatigue, decreased concentration, decreased sleep, decreased appetite and feelings of guilt and hopelessness.  He stated that he had been sober for 1 year.  He had been in a substance rehabilitation program prior to that.  He denied any recent alcohol or opiates.  Review of his laboratories showed a significant elevation his liver function enzymes (AST of 724), and is well an increase in his renal disease (previous creatinine 1.2-1.3, now 1.55).  As stated above he complained of significant musculoskeletal  pain.  On examination there is concern for possible rhabdomyolysis.  He was admitted to the hospital for evaluation and stabilization.  Continued Clinical Symptoms:  Alcohol Use Disorder Identification Test Final Score (AUDIT): 4 The "Alcohol Use Disorders Identification Test", Guidelines for Use in Primary Care, Second Edition.  World Science writer [facility]). Score between 0-7:  no or low risk or alcohol related problems. Score between 8-15:  moderate risk of alcohol related problems. Score between 16-19:  high risk of alcohol related problems. Score 20 or above:  warrants further diagnostic evaluation for alcohol dependence and treatment.   CLINICAL FACTORS:   Depression:   Anhedonia Comorbid alcohol abuse/dependence Hopelessness Impulsivity Insomnia Alcohol/Substance Abuse/Dependencies   Musculoskeletal: Strength & Muscle Tone: within normal limits Gait & Station: shuffle Patient leans: N/A  Psychiatric Specialty Exam: Physical Exam  Nursing note and vitals reviewed. Constitutional: He is oriented to person, place, and time. He appears well-developed and well-nourished.  HENT:  Head: Normocephalic and atraumatic.  Respiratory: Effort normal.  Neurological: He is alert and oriented to person, place, and time.    ROS  Blood pressure 119/83, pulse 95, temperature 98.4 F (36.9 C), temperature source Oral, resp. rate 20, height 5\' 11"  (1.803 m), weight 79.8 kg.Body mass index is 24.55 kg/m.  General Appearance: Disheveled  Eye Contact:  Fair  Speech:  Normal Rate  Volume:  Decreased  Mood:  Anxious and Depressed  Affect:  Congruent  Thought Process:  Coherent and Descriptions of Associations: Intact  Orientation:  Full (Time, Place, and Person)  Thought Content:  Logical  Suicidal Thoughts:  Yes.  without intent/plan  Homicidal Thoughts:  No  Memory:  Immediate;   Fair Recent;   Fair Remote;   Fair  Judgement:  Intact  Insight:  Fair  Psychomotor Activity:   Increased  Concentration:  Concentration: Fair and Attention Span: Fair  Recall:  [employer] of Knowledge:  Fair  Language:  Good  Akathisia:  Negative  Handed:  Right  AIMS (if indicated):     Assets:  Communication Skills Desire for Improvement Resilience  ADL's:  Intact  Cognition:  WNL  Sleep:  Number of Hours: 1.25      COGNITIVE FEATURES THAT CONTRIBUTE TO RISK:  None    SUICIDE RISK:   Mild:  Suicidal ideation of limited frequency, intensity, duration, and specificity.  There are no identifiable plans, no associated intent, mild dysphoria and related symptoms, good self-control (both objective and subjective assessment), few other risk factors, and identifiable protective factors, including available and accessible social support.  PLAN OF CARE: Patient is seen and examined.  Patient is a 47 year old male with the above-stated past psychiatric history who presented for admission secondary to relapsing on cocaine, suicidal ideation depressive symptoms.  He was admitted to the psychiatric unit.  Unfortunately his liver function enzymes are significantly elevated with an AST of 724, his creatinine is elevated at 1.55, and he complains of significant musculoskeletal pain.  Staff and I discussed the possibility of getting urine myoglobin, hepatitis panel, repeat liver function enzymes and repeat chemistries, but given the significance of the elevation in his liver function enzymes it was felt best to send him back to the medical side of the hospital and get these laboratories done.  Once he is cleared in the emergency department he will most likely return, and we will press forward with current treatment.  I certify that inpatient services furnished can reasonably be expected to improve the patient's condition.   Antonieta Pert, MD 01/09/2018, 9:17 AM

## 2018-01-09 NOTE — ED Notes (Signed)
Bed: ZO10 Expected date:  Expected time:  Means of arrival:  Comments: [facility]?

## 2018-01-09 NOTE — BHH Counselor (Signed)
Pt requested that CSW call his mother to let her know that he is safe because she is sick. CSW called Shafin Pollio at [phone]. Pt's sister answered the phone and reported she is sick so anything you need to talk to her about, you can talk to me about. CSW explained she is calling from the hospital to give a message, if she can just put her on the phone for a moment. She agreed to put it on speaker phone. CSW stated who she was and where she was calling from and that she was asked to call and let Lauris Poag know that Sandford is safe and in the hospital. CSW thanked both and ended the call.

## 2018-01-09 NOTE — Progress Notes (Signed)
Patient arrived on unit via stretcher from ED.  Sitter at bedside. No family at bedside.  Telemetry placed per MD order and CMT notified.

## 2018-01-09 NOTE — H&P (Signed)
History and Physical    Calvin Newman:[id number] DOB: 01-12-1971 DOA: 01/09/2018  PCP: Patient, No Pcp Per  Patient coming from: home  I have personally briefly reviewed patient's old medical records in [facility]  Chief Complaint: depression, SI, myalgias  HPI: Calvin Newman is a 47 y.o. male with medical history significant of depression and polysubstance abuse presenting with depression, suicidal ideation, and myalgias.   He notes that [birth year] was his birthday and he started to use cocaine with a family member.  He notes after that, he continued walking around the city for 4 days.  He notes that he was using cocaine during that period of time.  He denies sleeping during that period of time.  Prior to this has he been clean for about 1 year.  He notes that after relapsing, he became down and depressed.  He felt like he just wanted everything to and and did not want to live anymore.  He notes leg pain, that he thinks is due to significant walking.  He was unable to walk after well because of the pain.  He describes the pain is achy and being in his counts, coming and going.  He notes it felt like a charley horse that started after a few days of walking.  He denies any fevers, chills, cough, cold, chest pain, shortness of breath, abdominal pain, dysuria, numbness, tingling, weakness.  He does smoke.  He notes rare alcohol.  He also admits to cocaine use.   Patient was initially seen in the ED early on the morning of 10/13.  He was sent to behavioral health.  On initial evaluation by psychiatry the review of his labs showed elevated liver enzymes and he also had muscle pains as well and there was concern for possible rhabdo and need for further work-up so he was sent back to the emergency department for admission.  ED Course: Labs, IVF.  Admit for rhabdo.   Review of Systems: As per HPI otherwise 10 point review of systems negative.   History reviewed. No pertinent past  medical history.  Past Surgical History:  Procedure Laterality Date  . r hand surgery     fracture 4th and 5th metacarpal     reports that he has been smoking cigarettes. He has been smoking about 1.00 pack per day. He has never used smokeless tobacco. He reports that he drinks alcohol. He reports that he has current or past drug history. Drug: Cocaine.  No Known Allergies  Family History  Problem Relation Age of Onset  . Cancer Father    Prior to Admission medications   Not on File  Pt denies taking any medications prior to admission.  Physical Exam: There were no vitals filed for this visit. ED vitals with BP 124/76 HR 61, Temp 97.7, RR 15 Constitutional: NAD, calm, comfortable There were no vitals filed for this visit. Eyes: PERRL, lids and conjunctivae normal ENMT: Mucous membranes are moist. Posterior pharynx clear of any exudate or lesions.Normal dentition.  Neck: normal, supple, no masses, no thyromegaly Respiratory: clear to auscultation bilaterally, no wheezing, no crackles. Normal respiratory effort. No accessory muscle use.  Cardiovascular: Regular rate and rhythm, no murmurs / rubs / gallops. No extremity edema. 2+ pedal pulses. No carotid bruits.  Abdomen: no tenderness, no masses palpated. No hepatosplenomegaly. Bowel sounds positive.  Musculoskeletal: no clubbing / cyanosis. No joint deformity upper and lower extremities. Good ROM, no contractures. Tenderness to palpation to bilateral calves, soft  compartments. Skin: no rashes, lesions, ulcers. No induration Neurologic: CN 2-12 grossly intact. Sensation intact. Moving all extremities equally.  Psychiatric: Normal judgment and insight. Alert and oriented x 3. Normal mood.   Labs on Admission: I have personally reviewed following labs and imaging studies  CBC: Recent Labs  Lab 01/08/18 2008  WBC 7.2  HGB 14.7  HCT 43.4  MCV 86.6  PLT 299   Basic Metabolic Panel: Recent Labs  Lab 01/08/18 2008  01/09/18 1004  NA 135 140  K 3.5 3.4*  CL 103 104  CO2 23 26  GLUCOSE 169* 117*  BUN 29* 22*  CREATININE 1.55* 1.43*  CALCIUM 8.4* 8.3*  MG  --  2.1   GFR: Estimated Creatinine Clearance: 66.6 mL/min (A) (by C-G formula based on SCr of 1.43 mg/dL (H)). Liver Function Tests: Recent Labs  Lab 01/08/18 2008 01/09/18 1004  AST 743* 868*  ALT 93* 113*  ALKPHOS 40 37*  BILITOT 1.2 0.9  PROT 6.5 6.0*  ALBUMIN 3.3* 3.1*   No results for input(s): LIPASE, AMYLASE in the last 168 hours. No results for input(s): AMMONIA in the last 168 hours. Coagulation Profile: Recent Labs  Lab 01/09/18 1004  INR 0.84   Cardiac Enzymes: Recent Labs  Lab 01/09/18 1004  CKTOTAL >50,000*   BNP (last 3 results) No results for input(s): PROBNP in the last 8760 hours. HbA1C: No results for input(s): HGBA1C in the last 72 hours. CBG: No results for input(s): GLUCAP in the last 168 hours. Lipid Profile: No results for input(s): CHOL, HDL, LDLCALC, TRIG, CHOLHDL, LDLDIRECT in the last 72 hours. Thyroid Function Tests: No results for input(s): TSH, T4TOTAL, FREET4, T3FREE, THYROIDAB in the last 72 hours. Anemia Panel: No results for input(s): VITAMINB12, FOLATE, FERRITIN, TIBC, IRON, RETICCTPCT in the last 72 hours. Urine analysis:    Component Value Date/Time   COLORURINE yellow 01/08/2009 0000   APPEARANCEUR Clear 01/08/2009 0000   LABSPEC 1.025 01/08/2009 0000   PHURINE 5.5 01/08/2009 0000   HGBUR small 01/08/2009 0000   BILIRUBINUR negative 01/08/2009 0000   UROBILINOGEN 0.2 01/08/2009 0000   NITRITE negative 01/08/2009 0000    Radiological Exams on Admission: No results found.  EKG: Independently reviewed. none  Assessment/Plan Active Problems:   Rhabdomyolysis  Rhabdomyolysis: likely in the setting of cocaine use and extensive walking (he notes walking for past 4 days all over town and c/o calf pain).  Calves TTP on exam, but compartments soft.  Kidney function is elevated  from past (but only mildly so, in 2017, Cr was 1.35), but urinalysis is still pending.  CK>50,000. S/p bolus in ED Continue MIVF Follow daily CK and renal function I/O, daily weights  Elevated Liver Enzymes: Likely 2/2 rhabdomyolysis.  Follow acute hepatitis panel.  Follow right upper quadrant ultrasound.  Continue to trend.  Depression  Suicidal Ideation: Recruitment consultant, psych consult once medically cleared  Hyperglycemia: follow A1c, SSI as needed  Polysubstance abuse: encourage cessation, follow.  SW consult.  Thiamine, folate, MVI.  Hypokalemia: follow mag, replete as needed  DVT prophylaxis: lovenox  Code Status: full  Family Communication:  Called wife, but no answer.  No answering service. Disposition Plan: pending improvement  Consults called: none  Admission status: inpatient given rhabdo with CK >50,000.  Pt requires IVF and continued monitoring and potential further workup which will require greater than 2 midnights in my medical opinion.   Lacretia Nicks MD [facility] Pager [phone]   If 7PM-7AM, please contact night-coverage [email or web address]  Password [email or web address]  01/09/2018, 5:30 PM

## 2018-01-09 NOTE — ED Notes (Signed)
I have just phoned report to Lurena Joiner, RN on [address]. Will transport shortly. Pt. Remains in no distress in the presence of his sitter.

## 2018-01-09 NOTE — BH Assessment (Addendum)
Tele Assessment Note   Patient Name: Calvin Newman MRN: [id number] Referring Physician: Elpidio Anis, PA-C Location of Patient: [facility], [phone] Location of Provider: Behavioral Health TTS Department  Calvin Newman is an 47 y.o. married male who presents unaccompanied to [facility] ED reporting depressive symptoms, suicidal ideation and substance use. Pt states he has a history of using substances, was clean and sober for one year, and relapsed one week ago. He says that he has "lost everything" and that "everything in my life is messed up." Pt states "I shouldn't be alive on this earth." He denies specific suicide plan but repeatedly says he doesn't want to live. He says he has been walking around for the past four days. He denies any history of previous suicide attempts. Pt acknowledges symptoms including crying spells, social withdrawal, loss of interest in usual pleasures, fatigue, decreased concentration, decreased sleep, decreased appetite and feelings of guilt and hopelessness. He denies homicidal ideation or history of violence. He denies any history of psychotic symptoms. Pt reports he relapsed on cocaine, alcohol and marijuana (see below for details of use).  Pt identifies consequences of his substance use as his primary stressor. He indicated his wife and children are very upset with him. He is currently unemployed and has financial stress. He denies legal problems. He denies history of abuse or trauma. He denies any history of inpatient or outpatient mental health or substance abuse treatment.  Pt is dressed in hospital scrubs, very drowsy and had difficulty staying awake during assessment. He is oriented x4. Pt speaks in a clear tone, at  low volume and normal pace. Motor behavior appears normal. Eye contact is good. Pt's mood is depressed and affect is congruent with mood. Thought process is coherent and relevant. There is no indication Pt is currently responding to internal  stimuli or experiencing delusional thought content. Pt was cooperative throughout assessment. He says he wants treatment and is willing to sign voluntarily into a psychiatric facility.    Diagnosis:  F33.2 Major depressive disorder, Recurrent episode, Severe F14.20 Cocaine use disorder, Severe F10.20 Alcohol use disorder, Severe  Past Medical History: History reviewed. No pertinent past medical history.  Past Surgical History:  Procedure Laterality Date  . r hand surgery     fracture 4th and 5th metacarpal    Family History:  Family History  Problem Relation Age of Onset  . Cancer Father     Social History:  reports that he has been smoking cigarettes. He has been smoking about 1.00 pack per day. He does not have any smokeless tobacco history on file. He reports that he drinks alcohol. He reports that he has current or past drug history. Drug: Cocaine.  Additional Social History:  Alcohol / Drug Use Pain Medications: Pt denies Prescriptions: Pt denies Over the Counter: Pt denies History of alcohol / drug use?: Yes Longest period of sobriety (when/how long): One year Negative Consequences of Use: Financial, Personal relationships, Work / School Substance #1 Name of Substance 1: Cocaine (powder) 1 - Age of First Use: 26 1 - Amount (size/oz): Pt cannot estimate 1 - Frequency: Ongoing 1 - Duration: Relapsed one week ago 1 - Last Use / Amount: 01/08/18 Substance #2 Name of Substance 2: Alcohol 2 - Age of First Use: Adolsecent 2 - Amount (size/oz): Varies, Pt would not specify 2 - Frequency: Daily 2 - Duration: Relapsed one week ago 2 - Last Use / Amount: 01/08/18 Substance #3 Name of Substance 3: Cannabis  3 - Age of First Use: Adolescent 3 - Amount (size/oz): 1 joint 3 - Frequency: 1-2 times per week  3 - Duration: Relapsed one week ago 3 - Last Use / Amount: 01/08/18  CIWA: CIWA-Ar BP: 135/85 Pulse Rate: 96 COWS:    Allergies: No Known Allergies  Home  Medications:  (Not in a hospital admission)  OB/GYN Status:  No LMP for male patient.  General Assessment Data Location of Assessment: [facility] ED TTS Assessment: In system Is this a Tele or Face-to-Face Assessment?: Tele Assessment Is this an Initial Assessment or a Re-assessment for this encounter?: Initial Assessment Patient Accompanied by:: N/A(Alone) Language Other than English: No Living Arrangements: Other (Comment)(Wife) What gender do you identify as?: Male Marital status: Married Calvin Newman name: NA Pregnancy Status: No Living Arrangements: Spouse/significant other, Children Can pt return to current living arrangement?: Yes Admission Status: Voluntary Is patient capable of signing voluntary admission?: Yes Referral Source: Self/Family/Friend Insurance type: Self-pay     Crisis Care Plan Living Arrangements: Spouse/significant other, Children Legal Guardian: Other:(Self) Name of Psychiatrist: None Name of Therapist: None  Education Status Is patient currently in school?: No Is the patient employed, unemployed or receiving disability?: Unemployed  Risk to self with the past 6 months Suicidal Ideation: Yes-Currently Present Has patient been a risk to self within the past 6 months prior to admission? : Yes Suicidal Intent: No Has patient had any suicidal intent within the past 6 months prior to admission? : No Is patient at risk for suicide?: Yes Suicidal Plan?: No Has patient had any suicidal plan within the past 6 months prior to admission? : No Access to Means: No What has been your use of drugs/alcohol within the last 12 months?: Pt reports using cocaine, alcohol and marijuana Previous Attempts/Gestures: No How many times?: 0 Other Self Harm Risks: None Triggers for Past Attempts: None known Intentional Self Injurious Behavior: None Family Suicide History: No Recent stressful life event(s): Financial Problems, Conflict (Comment) Persecutory voices/beliefs?:  No Depression: Yes Depression Symptoms: Despondent, Insomnia, Tearfulness, Isolating, Fatigue, Guilt, Loss of interest in usual pleasures, Feeling worthless/self pity Substance abuse history and/or treatment for substance abuse?: Yes Suicide prevention information given to non-admitted patients: Not applicable  Risk to Others within the past 6 months Homicidal Ideation: No Does patient have any lifetime risk of violence toward others beyond the six months prior to admission? : No Thoughts of Harm to Others: No Current Homicidal Intent: No Current Homicidal Plan: No Access to Homicidal Means: No Identified Victim: None History of harm to others?: No Assessment of Violence: None Noted Violent Behavior Description: Pt denies history of violence Does patient have access to weapons?: No Criminal Charges Pending?: No Does patient have a court date: No Is patient on probation?: No  Psychosis Hallucinations: None noted Delusions: None noted  Mental Status Report Appearance/Hygiene: In scrubs Eye Contact: Poor Motor Activity: Unremarkable Speech: Logical/coherent Level of Consciousness: Drowsy Mood: Depressed Affect: Depressed Anxiety Level: Minimal Thought Processes: Coherent, Relevant Judgement: Partial Orientation: Person, Place, Time, Situation, Appropriate for developmental age Obsessive Compulsive Thoughts/Behaviors: None  Cognitive Functioning Concentration: Normal Memory: Recent Intact, Remote Intact Is patient IDD: No Insight: Poor Impulse Control: Fair Appetite: Fair Have you had any weight changes? : No Change Sleep: No Change Total Hours of Sleep: 8 Vegetative Symptoms: None  ADLScreening [facility] Assessment Services) Patient's cognitive ability adequate to safely complete daily activities?: Yes Patient able to express need for assistance with ADLs?: Yes Independently performs ADLs?: Yes (appropriate for developmental  age)  Prior Inpatient Therapy Prior  Inpatient Therapy: No  Prior Outpatient Therapy Prior Outpatient Therapy: No Does patient have an ACCT team?: No Does patient have Intensive In-House Services?  : No Does patient have [facility] services? : No Does patient have P4CC services?: No  ADL Screening (condition at time of admission) Patient's cognitive ability adequate to safely complete daily activities?: Yes Is the patient deaf or have difficulty hearing?: No Does the patient have difficulty seeing, even when wearing glasses/contacts?: No Does the patient have difficulty concentrating, remembering, or making decisions?: No Patient able to express need for assistance with ADLs?: Yes Does the patient have difficulty dressing or bathing?: No Independently performs ADLs?: Yes (appropriate for developmental age) Does the patient have difficulty walking or climbing stairs?: No Weakness of Legs: None Weakness of Arms/Hands: None  Home Assistive Devices/Equipment Home Assistive Devices/Equipment: None    Abuse/Neglect Assessment (Assessment to be complete while patient is alone) Abuse/Neglect Assessment Can Be Completed: Yes Physical Abuse: Denies Verbal Abuse: Denies Sexual Abuse: Denies Exploitation of patient/patient's resources: Denies Self-Neglect: Denies     Merchant navy officer (For Healthcare) Does Patient Have a Medical Advance Directive?: No Would patient like information on creating a medical advance directive?: No - Patient declined          Disposition: [facility], [facility] confirmed bed availability. Gave clinical report to Donell Sievert, PA who said Pt meets criteria for inpatient dual-diagnosis treatment. Pt accepted to the service of Dr. Tamera Punt, room 307-2. Notified Elpidio Anis, PA-C and Baird Lyons, RN of acceptance.  Disposition Initial Assessment Completed for this Encounter: Yes Patient referred to: Other (Comment)  This service was provided via telemedicine using a 2-way, interactive audio and video  technology.  Names of all persons participating in this telemedicine service and their role in this encounter. Name: Calvin Newman Role: Patient  Name: Shela Commons, [place] Role: TTS counselor         Harlin Rain Patsy [place], [facility], [facility], [facility] Triage Specialist [phone]  Pamalee Leyden 01/09/2018 12:40 AM

## 2018-01-09 NOTE — ED Provider Notes (Signed)
[facility]-EMERGENCY DEPT Provider Note   CSN: [id number] Arrival date & time: 01/09/18  0310     History   Chief Complaint No chief complaint on file.   HPI Calvin Newman is a 47 y.o. male.  HPI  Sent from behavioral health for further examination and evaluation.  Reportedly seen around 12 hours ago had lab work that showed elevated LFTs and mild renal insufficiency.  These both reportedly new to patient.  Patient states he does not drink that much alcohol but the LFT elevation was considered secondary to the alcohol.  Patient psychiatrist Perfecto Kingdom looks like by the notes he was worried about rhabdomyolysis.  Reviewing notes it states that they were not able to get labs in an expeditious manner there so he was sent to the ER.  Patient states he does use cocaine.  Only rare alcohol use.  No history of hepatitis.  States he has been walking a lot in his muscles are somewhat sore.  History reviewed. No pertinent past medical history.  Patient Active Problem List   Diagnosis Date Noted  . MDD (major depressive disorder), severe (HCC) 01/09/2018  . CHLAMYDIAL INFECTION 01/09/2009  . TOBACCO ABUSE 01/08/2009  . HEMOCCULT POSITIVE STOOL 01/08/2009  . ACID REFLUX DISEASE 11/26/2008    Past Surgical History:  Procedure Laterality Date  . r hand surgery     fracture 4th and 5th metacarpal        Home Medications    Prior to Admission medications   Not on File    Family History Family History  Problem Relation Age of Onset  . Cancer Father     Social History Social History   Tobacco Use  . Smoking status: Current Every Day Smoker    Packs/day: 1.00    Types: Cigarettes  . Smokeless tobacco: Never Used  Substance Use Topics  . Alcohol use: Yes    Comment: occasional  . Drug use: Yes    Types: Cocaine     Allergies   Patient has no known allergies.   Review of Systems Review of Systems  Constitutional: Positive for appetite  change.  HENT: Negative for congestion.   Respiratory: Negative for shortness of breath.   Cardiovascular: Negative for chest pain.  Gastrointestinal: Negative for abdominal pain.  Genitourinary: Negative for flank pain.  Musculoskeletal: Positive for myalgias.  Skin: Negative for rash.  Neurological: Negative for weakness.  Psychiatric/Behavioral: Negative for confusion.     Physical Exam Updated Vital Signs BP 124/76 (BP Location: Right Arm)   Pulse 61   Temp 97.7 F (36.5 C) (Oral)   Resp 15   Ht 5\' 11"  (1.803 m)   Wt 79.8 kg   SpO2 99%   BMI 24.55 kg/m   Physical Exam  Constitutional: He appears well-developed.  HENT:  Head: Normocephalic.  Eyes: Pupils are equal, round, and reactive to light. No scleral icterus.  Neck: Neck supple.  Cardiovascular: Normal rate.  Pulmonary/Chest: Effort normal.  Abdominal: There is no tenderness.  Musculoskeletal: He exhibits no tenderness.  No muscle tenderness.  Neurological: He is alert.  Skin: Skin is warm. Capillary refill takes less than 2 seconds.     ED Treatments / Results  Labs (all labs ordered are listed, but only abnormal results are displayed) Labs Reviewed  COMPREHENSIVE METABOLIC PANEL - Abnormal; Notable for the following components:      Result Value   Potassium 3.4 (*)    Glucose, Bld 117 (*)  BUN 22 (*)    Creatinine, Ser 1.43 (*)    Calcium 8.3 (*)    Total Protein 6.0 (*)    Albumin 3.1 (*)    AST 868 (*)    ALT 113 (*)    Alkaline Phosphatase 37 (*)    GFR calc non Af Amer 56 (*)    All other components within normal limits  CK - Abnormal; Notable for the following components:   Total CK >50,000 (*)    All other components within normal limits  PROTIME-INR  MYOGLOBIN, URINE  LACTIC ACID, PLASMA  LACTIC ACID, PLASMA  HEPATITIS PANEL, ACUTE  URINALYSIS, ROUTINE W REFLEX MICROSCOPIC    EKG None  Radiology No results found.  Procedures Procedures (including critical care  time)  Medications Ordered in ED Medications  alum & mag hydroxide-simeth (MAALOX/MYLANTA) 200-200-20 MG/5ML suspension 30 mL (has no administration in time range)  magnesium hydroxide (MILK OF MAGNESIA) suspension 30 mL (has no administration in time range)  traZODone (DESYREL) tablet 100 mg (has no administration in time range)  loperamide (IMODIUM) capsule 2-4 mg (has no administration in time range)  methocarbamol (ROBAXIN) tablet 500 mg (500 mg Oral Given 01/09/18 0402)  naproxen (NAPROSYN) tablet 500 mg (500 mg Oral Given 01/09/18 0402)  ondansetron (ZOFRAN-ODT) disintegrating tablet 4 mg (has no administration in time range)  pneumococcal 23 valent vaccine (PNU-IMMUNE) injection 0.5 mL (has no administration in time range)  nicotine (NICODERM CQ - dosed in mg/24 hours) patch 21 mg (21 mg Transdermal Patch Applied 01/09/18 0923)  sodium chloride 0.9 % bolus 1,000 mL (1,000 mLs Intravenous New Bag/Given 01/09/18 1334)  sodium bicarbonate 150 mEq in dextrose 5 % 1,000 mL infusion (has no administration in time range)  naproxen (NAPROSYN) 500 MG tablet (  Duplicate 01/09/18 0402)  methocarbamol (ROBAXIN) 500 MG tablet (0 mg  Duplicate 01/09/18 0402)     Initial Impression / Assessment and Plan / ED Course  I have reviewed the triage vital signs and the nursing notes.  Pertinent labs & imaging results that were available during my care of the patient were reviewed by me and considered in my medical decision making (see chart for details).     Patient with rhabdomyolysis.  Likely due to cocaine and extra ambulation.  LFTs have been increasing.  Creatinine actually improved since yesterday but elevated above baseline.  CK greater than 50,000.  Will admit to hospitalist.  Final Clinical Impressions(s) / ED Diagnoses   Final diagnoses:  Non-traumatic rhabdomyolysis  Cocaine abuse [facility])    ED Discharge Orders    None      Benjiman Core, MD 01/09/18 1429

## 2018-01-10 LAB — COMPREHENSIVE METABOLIC PANEL
ALT: 97 U/L — ABNORMAL HIGH (ref 0–44)
AST: 637 U/L — AB (ref 15–41)
Albumin: 2.3 g/dL — ABNORMAL LOW (ref 3.5–5.0)
Alkaline Phosphatase: 27 U/L — ABNORMAL LOW (ref 38–126)
Anion gap: 8 (ref 5–15)
BILIRUBIN TOTAL: 0.7 mg/dL (ref 0.3–1.2)
BUN: 14 mg/dL (ref 6–20)
CO2: 22 mmol/L (ref 22–32)
CREATININE: 1.14 mg/dL (ref 0.61–1.24)
Calcium: 7.6 mg/dL — ABNORMAL LOW (ref 8.9–10.3)
Chloride: 109 mmol/L (ref 98–111)
Glucose, Bld: 99 mg/dL (ref 70–99)
POTASSIUM: 3.9 mmol/L (ref 3.5–5.1)
Sodium: 139 mmol/L (ref 135–145)
TOTAL PROTEIN: 4.7 g/dL — AB (ref 6.5–8.1)

## 2018-01-10 LAB — CBC
HCT: 38 % — ABNORMAL LOW (ref 39.0–52.0)
HEMOGLOBIN: 12.3 g/dL — AB (ref 13.0–17.0)
MCH: 29.1 pg (ref 26.0–34.0)
MCHC: 32.4 g/dL (ref 30.0–36.0)
MCV: 90 fL (ref 80.0–100.0)
NRBC: 0 % (ref 0.0–0.2)
Platelets: 236 10*3/uL (ref 150–400)
RBC: 4.22 MIL/uL (ref 4.22–5.81)
RDW: 14.1 % (ref 11.5–15.5)
WBC: 4.8 10*3/uL (ref 4.0–10.5)

## 2018-01-10 LAB — HEMOGLOBIN A1C
HEMOGLOBIN A1C: 6.4 % — AB (ref 4.8–5.6)
MEAN PLASMA GLUCOSE: 136.98 mg/dL

## 2018-01-10 LAB — HEPATITIS PANEL, ACUTE
HCV Ab: <0.1 s/co ratio (ref 0.0–0.9)
HEP B C IGM: NEGATIVE
HEP B S AG: NEGATIVE
Hep A IgM: NEGATIVE

## 2018-01-10 LAB — MYOGLOBIN, URINE: MYOGLOBIN UR: 3904 ng/mL — AB (ref 0–13)

## 2018-01-10 LAB — CK: CK TOTAL: 45558 U/L — AB (ref 49–397)

## 2018-01-10 MED ORDER — NICOTINE 21 MG/24HR TD PT24
21.0000 mg | MEDICATED_PATCH | Freq: Every day | TRANSDERMAL | Status: DC
  Administered 2018-01-10 – 2018-01-12 (×3): 21 mg via TRANSDERMAL
  Filled 2018-01-10 (×4): qty 1

## 2018-01-10 MED ORDER — SODIUM CHLORIDE 0.9 % IV SOLN
INTRAVENOUS | Status: DC
  Administered 2018-01-10 – 2018-01-13 (×12): via INTRAVENOUS

## 2018-01-10 MED ORDER — NICOTINE 14 MG/24HR TD PT24: 14.0000 mg | MEDICATED_PATCH | Freq: Every day | TRANSDERMAL | Status: DC

## 2018-01-10 NOTE — Progress Notes (Addendum)
PROGRESS NOTE    Calvin Newman  ZOX:[id number] DOB: 1971/02/22 DOA: 01/09/2018 PCP: Patient, No Pcp Per   Brief Narrative: Patient is a 47 year old male with past medical history significant for depression, polysubstance abuse who presented with myalgias, suicidal ideation depression.  He was initially admitted to behavioral health with suicidal ideation but sent back to the emergency department for further work-up of rhabdomyolysis.  Assessment & Plan:   Active Problems:   Rhabdomyolysis  Rhabdomyolysis: Most likely triggered by excessive walking in the setting of cocaine abuse.  Patient says that he was encouraged by his uncle to take cocaine.  Presented with acute kidney injury which has resolved with IV fluids.  CK level in the range of 40,000s.  Continue IV fluids.  Daily monitoring of CK level and renal function.  AKI: Resolved with IV fluids  Elevated liver enzymes: Associated with rhabdomyolysis.  We will follow-up acute hepatitis panel  Depression/suicidal ideation: Initially sent to the behavioral health department.  Currently in IVC due to suicidal ideation.  Corporate investment banker.  Will request for psychiatric reevaluation today because he denies any suicidal ideation at present and wants to go home.  This will help [place] for discharge plan when he is medically ready.  Hypoglycemia: Continue sliding scale insulin.  HbA1C is 6.4.  Polysubstance abuse: Counseled for cessation.  He says he is motivated to quit.  Is also started on thiamine and folic acid.  Smoker: Smokes.  Counseled for cessation.  Continue nicotine patch  Hypokalemia: Supplemented   DVT prophylaxis: Lovenox Code Status: Full Family Communication: Called wife on her cell phone but the call was not received Disposition Plan: Home versus psychiatry department after psychiatry evaluation and improvement in the CK level   Consultants: None  Procedures: None  Antimicrobials: None  Subjective: Patient seen  and examined the bedside this morning.  Remains comfortable.  Denies any complaints.  Wants to go home.  Denies any suicidal ideation during my evaluation.  Objective: Vitals:   01/09/18 1740 01/09/18 2043 01/10/18 0537  BP:  (!) 146/90 109/77  Pulse:  65 72  Resp:  17 15  Temp:  97.8 F (36.6 C) 98.1 F (36.7 C)  TempSrc:  Oral Oral  SpO2:  100% 93%  Weight:   80.6 kg  Height: 5\' 11"  (1.803 m)      Intake/Output Summary (Last 24 hours) at 01/10/2018 1244 Last data filed at 01/10/2018 1240 Gross per 24 hour  Intake 561.72 ml  Output 276 ml  Net 285.72 ml   Filed Weights   01/10/18 0537  Weight: 80.6 kg    Examination:  General exam: Appears calm and comfortable ,Not in distress,average built HEENT:PERRL,Oral mucosa moist, Ear/Nose normal on gross exam Respiratory system: Bilateral equal air entry, normal vesicular breath sounds, no wheezes or crackles  Cardiovascular system: S1 & S2 heard, RRR. No JVD, murmurs, rubs, gallops or clicks. No pedal edema. Gastrointestinal system: Abdomen is nondistended, soft and nontender. No organomegaly or masses felt. Normal bowel sounds heard. Central nervous system: Alert and oriented. No focal neurological deficits. Extremities: No edema, no clubbing ,no cyanosis, distal peripheral pulses palpable. Skin: No rashes, lesions or ulcers,no icterus ,no pallor MSK: Normal muscle bulk,tone ,power Psychiatry: Judgement and insight appear normal. Mood & affect appropriate.     Data Reviewed: I have personally reviewed following labs and imaging studies  CBC: Recent Labs  Lab 01/08/18 2008 01/10/18 0601  WBC 7.2 4.8  HGB 14.7 12.3*  HCT 43.4 38.0*  MCV  86.6 90.0  PLT 299 236   Basic Metabolic Panel: Recent Labs  Lab 01/08/18 2008 01/09/18 1004 01/10/18 0601  NA 135 140 139  K 3.5 3.4* 3.9  CL 103 104 109  CO2 23 26 22   GLUCOSE 169* 117* 99  BUN 29* 22* 14  CREATININE 1.55* 1.43* 1.14  CALCIUM 8.4* 8.3* 7.6*  MG  --  2.1   --    GFR: Estimated Creatinine Clearance: 83.5 mL/min (by C-G formula based on SCr of 1.14 mg/dL). Liver Function Tests: Recent Labs  Lab 01/08/18 2008 01/09/18 1004 01/10/18 0601  AST 743* 868* 637*  ALT 93* 113* 97*  ALKPHOS 40 37* 27*  BILITOT 1.2 0.9 0.7  PROT 6.5 6.0* 4.7*  ALBUMIN 3.3* 3.1* 2.3*   No results for input(s): LIPASE, AMYLASE in the last 168 hours. No results for input(s): AMMONIA in the last 168 hours. Coagulation Profile: Recent Labs  Lab 01/09/18 1004  INR 0.84   Cardiac Enzymes: Recent Labs  Lab 01/09/18 1004 01/10/18 0601  CKTOTAL >50,000* 45,558*   BNP (last 3 results) No results for input(s): PROBNP in the last 8760 hours. HbA1C: Recent Labs    01/10/18 0601  HGBA1C 6.4*   CBG: No results for input(s): GLUCAP in the last 168 hours. Lipid Profile: No results for input(s): CHOL, HDL, LDLCALC, TRIG, CHOLHDL, LDLDIRECT in the last 72 hours. Thyroid Function Tests: No results for input(s): TSH, T4TOTAL, FREET4, T3FREE, THYROIDAB in the last 72 hours. Anemia Panel: No results for input(s): VITAMINB12, FOLATE, FERRITIN, TIBC, IRON, RETICCTPCT in the last 72 hours. Sepsis Labs: No results for input(s): PROCALCITON, LATICACIDVEN in the last 168 hours.  No results found for this or any previous visit (from the past 240 hour(s)).       Radiology Studies: No results found.      Scheduled Meds: . enoxaparin (LOVENOX) injection  40 mg Subcutaneous Q24H  . folic acid  1 mg Oral Daily  . Influenza vac split quadrivalent PF  0.5 mL Intramuscular Tomorrow-1000  . multivitamin with minerals  1 tablet Oral Daily  . polyethylene glycol  17 g Oral Daily  . thiamine  100 mg Oral Daily   Continuous Infusions: . sodium chloride       LOS: 1 day    Time spent:25 mins. More than 50% of that time was spent in counseling and/or coordination of care.      Burnadette Pop, MD [facility] Pager [phone]  If 7PM-7AM, please  contact night-coverage [email or web address] Password [email or web address] 01/10/2018, 12:44 PM

## 2018-01-11 DIAGNOSIS — F419 Anxiety disorder, unspecified: Secondary | ICD-10-CM

## 2018-01-11 DIAGNOSIS — F17210 Nicotine dependence, cigarettes, uncomplicated: Secondary | ICD-10-CM

## 2018-01-11 DIAGNOSIS — F329 Major depressive disorder, single episode, unspecified: Secondary | ICD-10-CM

## 2018-01-11 DIAGNOSIS — R45851 Suicidal ideations: Secondary | ICD-10-CM

## 2018-01-11 DIAGNOSIS — M6282 Rhabdomyolysis: Secondary | ICD-10-CM

## 2018-01-11 LAB — COMPREHENSIVE METABOLIC PANEL
ALK PHOS: 33 U/L — AB (ref 38–126)
ALT: 117 U/L — ABNORMAL HIGH (ref 0–44)
ANION GAP: 6 (ref 5–15)
AST: 773 U/L — ABNORMAL HIGH (ref 15–41)
Albumin: 2.8 g/dL — ABNORMAL LOW (ref 3.5–5.0)
BILIRUBIN TOTAL: 0.6 mg/dL (ref 0.3–1.2)
BUN: 10 mg/dL (ref 6–20)
CALCIUM: 8.3 mg/dL — AB (ref 8.9–10.3)
CO2: 26 mmol/L (ref 22–32)
CREATININE: 1 mg/dL (ref 0.61–1.24)
Chloride: 112 mmol/L — ABNORMAL HIGH (ref 98–111)
Glucose, Bld: 103 mg/dL — ABNORMAL HIGH (ref 70–99)
Potassium: 3.9 mmol/L (ref 3.5–5.1)
SODIUM: 144 mmol/L (ref 135–145)
TOTAL PROTEIN: 5.3 g/dL — AB (ref 6.5–8.1)

## 2018-01-11 LAB — CK: Total CK: 39687 U/L — ABNORMAL HIGH (ref 49–397)

## 2018-01-11 MED ORDER — LIP MEDEX EX OINT
TOPICAL_OINTMENT | CUTANEOUS | Status: DC | PRN
  Administered 2018-01-11: 1 via TOPICAL
  Filled 2018-01-11: qty 7

## 2018-01-11 NOTE — Consult Note (Signed)
[name] Consult   Reason for Consult:  Suicide risk assessment  Referring Physician:  Dr. Tawanna Solo Patient Identification: Calvin Newman MRN:  [id number] Principal Diagnosis: Anxiety and depression Diagnosis:   Patient Active Problem List   Diagnosis Date Noted  . Depression [F32.9] 01/11/2018  . Suicidal ideation [R45.851] 01/11/2018  . MDD (major depressive disorder), severe ([place]) [F32.2] 01/09/2018  . Rhabdomyolysis [M62.82] 01/09/2018  . CHLAMYDIAL INFECTION [A56.01] 01/09/2009  . TOBACCO ABUSE [F17.200] 01/08/2009  . HEMOCCULT POSITIVE STOOL [K92.1] 01/08/2009  . ACID REFLUX DISEASE [K21.9] 11/26/2008    Total Time spent with patient: 1 hour  Subjective:   Calvin Newman is a 47 y.o. male patient admitted with rhabdomyolysis.  HPI:   Per chart review, patient was admitted to [facility] for depression with SI. He was sent to the ED and later admitted to the medical floor for rhabdomyolysis which was most likely triggered by excessive walking in the setting of cocaine abuse. He reports continuous use of cocaine for 4 days.  CK was > 50,000 on admission and is currently 39,687. He now denies SI and would like to discharge home. Per HPI from [facility] admission note, he reports abstinence from cocaine for a year and relapsed on his birthday after his friend offered him cocaine. He reported losing everything and is currently homeless.   On interview, Mr. Chelf reports that he has been "going through some things."  He reports that he recently relapsed on cocaine use after being abstinent for 1 year.  He reports that he was offered cocaine by his son's father-in-law on his birthday.  He reports that he used it self medicate due to feeling depressed for several days in the setting of multiple psychosocial stressors including marital and financial stressors.  He reports that he recently found out that his wife has been unfaithful.  His mother recently had a fall and his  sister has bone cancer.  He denies current SI, HI or AVH.  He reports that he just wants to be happy and has multiple reasons to live including for his 4 sons.  He plans to go back to [place] after he is discharged from the hospital.  He denies problems with sleep or appetite.  He has never taken medications for depression although he reports feeling depressed in the past.  He denies a history of manic symptoms (decreased need for sleep, increased energy, pressured speech or euphoria).  He denies access to guns or weapons.  Past Psychiatric History: Denies    Risk to Self:  None. Denies SI.  Risk to Others:  None. Denies HI.  Prior Inpatient Therapy:  Denies  Prior Outpatient Therapy:  Denies   Past Medical History: History reviewed. No pertinent past medical history.  Past Surgical History:  Procedure Laterality Date  . r hand surgery     fracture 4th and 5th metacarpal   Family History:  Family History  Problem Relation Age of Onset  . Cancer Father    Family Psychiatric  History: Denies  Social History:  Social History   Substance and Sexual Activity  Alcohol Use Yes   Comment: occasional     Social History   Substance and Sexual Activity  Drug Use Yes  . Types: Cocaine    Social History   Socioeconomic History  . Marital status: Married    Spouse name: Not on file  . Number of children: Not on file  . Years of education: Not on file  .  Highest education level: Some college, no degree  Occupational History  . Not on file  Social Needs  . Financial resource strain: Not on file  . Food insecurity:    Worry: Not on file    Inability: Not on file  . Transportation needs:    Medical: Not on file    Non-medical: Not on file  Tobacco Use  . Smoking status: Current Every Day Smoker    Packs/day: 1.00    Types: Cigarettes  . Smokeless tobacco: Never Used  Substance and Sexual Activity  . Alcohol use: Yes    Comment: occasional  . Drug use: Yes    Types: Cocaine   . Sexual activity: Yes  Lifestyle  . Physical activity:    Days per week: Not on file    Minutes per session: Not on file  . Stress: Not on file  Relationships  . Social connections:    Talks on phone: Not on file    Gets together: Not on file    Attends religious service: Not on file    Active member of club or organization: Not on file    Attends meetings of clubs or organizations: Not on file    Relationship status: Not on file  Other Topics Concern  . Not on file  Social History Narrative  . Not on file   Additional Social History: He is from [place].  He lives at home with his wife and 4 sons (5, 28, 74 and 36 y/o). 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 He reports a prior history of cocaine abuse. He was sober for a year and recently relapsed. He denies other illicit substance use or alcohol use.     Allergies:  No Known Allergies  Labs:  Results for orders placed or performed during the hospital encounter of 01/09/18 (from the past 48 hour(s))  Comprehensive metabolic panel     Status: Abnormal   Collection Time: 01/10/18  6:01 AM  Result Value Ref Range   Sodium 139 135 - 145 mmol/L   Potassium 3.9 3.5 - 5.1 mmol/L   Chloride 109 98 - 111 mmol/L   CO2 22 22 - 32 mmol/L   Glucose, Bld 99 70 - 99 mg/dL   BUN 14 6 - 20 mg/dL   Creatinine, Ser 1.14 0.61 - 1.24 mg/dL   Calcium 7.6 (L) 8.9 - 10.3 mg/dL   Total Protein 4.7 (L) 6.5 - 8.1 g/dL   Albumin 2.3 (L) 3.5 - 5.0 g/dL   AST 637 (H) 15 - 41 U/L   ALT 97 (H) 0 - 44 U/L   Alkaline Phosphatase 27 (L) 38 - 126 U/L   Total Bilirubin 0.7 0.3 - 1.2 mg/dL   GFR calc non Af Amer >60 >60 mL/min   GFR calc Af Amer >60 >60 mL/min    Comment: (NOTE) The eGFR has been calculated using the CKD EPI equation. This calculation has not been validated in all clinical situations. eGFR's persistently <60 mL/min signify possible Chronic Kidney Disease.    Anion gap 8 5 - 15     Comment: Performed at [facility], [place] [address], [place], [place] [address]  CBC     Status: Abnormal   Collection Time: 01/10/18  6:01 AM  Result Value Ref Range   WBC 4.8 4.0 - 10.5 K/uL   RBC 4.22 4.22 - 5.81 MIL/uL   Hemoglobin 12.3 (L) 13.0 - 17.0  g/dL   HCT 38.0 (L) 39.0 - 52.0 %   MCV 90.0 80.0 - 100.0 fL   MCH 29.1 26.0 - 34.0 pg   MCHC 32.4 30.0 - 36.0 g/dL   RDW 14.1 11.5 - 15.5 %   Platelets 236 150 - 400 K/uL   nRBC 0.0 0.0 - 0.2 %    Comment: Performed at [facility], [place] [address] [place], [place] [address]  CK     Status: Abnormal   Collection Time: 01/10/18  6:01 AM  Result Value Ref Range   Total CK 45,558 (H) 49 - 397 U/L    Comment: RESULTS CONFIRMED BY MANUAL DILUTION Performed at [facility], [place] [address], [place], [place] [address]   Hemoglobin A1c     Status: Abnormal   Collection Time: 01/10/18  6:01 AM  Result Value Ref Range   Hgb A1c MFr Bld 6.4 (H) 4.8 - 5.6 %    Comment: (NOTE) Pre diabetes:          5.7%-6.4% Diabetes:              >6.4% Glycemic control for   <7.0% adults with diabetes    Mean Plasma Glucose 136.98 mg/dL    Comment: Performed at [place] [address], [place], [place] [address]  CK     Status: Abnormal   Collection Time: 01/11/18  5:59 AM  Result Value Ref Range   Total CK 39,687 (H) 49 - 397 U/L    Comment: RESULTS CONFIRMED BY MANUAL DILUTION Performed at [facility], [place] [address] [place], [place] [address]   Comprehensive metabolic panel     Status: Abnormal   Collection Time: 01/11/18  5:59 AM  Result Value Ref Range   Sodium 144 135 - 145 mmol/L   Potassium 3.9 3.5 - 5.1 mmol/L   Chloride 112 (H) 98 - 111 mmol/L   CO2 26 22 - 32 mmol/L   Glucose, Bld 103 (H) 70 - 99 mg/dL   BUN 10 6 - 20 mg/dL   Creatinine, Ser 1.00 0.61 - 1.24 mg/dL   Calcium 8.3 (L) 8.9 - 10.3 mg/dL   Total Protein 5.3 (L) 6.5 - 8.1 g/dL    Albumin 2.8 (L) 3.5 - 5.0 g/dL   AST 773 (H) 15 - 41 U/L   ALT 117 (H) 0 - 44 U/L   Alkaline Phosphatase 33 (L) 38 - 126 U/L   Total Bilirubin 0.6 0.3 - 1.2 mg/dL   GFR calc non Af Amer >60 >60 mL/min   GFR calc Af Amer >60 >60 mL/min    Comment: (NOTE) The eGFR has been calculated using the CKD EPI equation. This calculation has not been validated in all clinical situations. eGFR's persistently <60 mL/min signify possible Chronic Kidney Disease.    Anion gap 6 5 - 15    Comment: Performed at [facility], [place] [address], [place], [place] [address]    Current Facility-Administered Medications  Medication Dose Route Frequency Provider Last Rate Last Dose  . 0.9 %  sodium chloride infusion   Intravenous Continuous Shelly Coss, MD 200 mL/hr at 01/11/18 0747    . enoxaparin (LOVENOX) injection 40 mg  40 mg Subcutaneous Q24H [name]., MD   40 mg at 01/10/18 2200  . folic acid (FOLVITE) tablet 1 mg  1 mg Oral Daily [name]., MD   1 mg at 01/11/18 0956  .  lip balm (CARMEX) ointment   Topical PRN Shelly Coss, MD   1 application at 27/07/86 0955  . multivitamin with minerals tablet 1 tablet  1 tablet Oral Daily [name]., MD   1 tablet at 01/11/18 0956  . nicotine (NICODERM CQ - dosed in mg/24 hours) patch 21 mg  21 mg Transdermal Daily Shelly Coss, MD   21 mg at 01/11/18 0957  . oxyCODONE (Oxy IR/ROXICODONE) immediate release tablet 5 mg  5 mg Oral Q4H PRN [name]., MD   5 mg at 01/10/18 1916  . polyethylene glycol (MIRALAX / GLYCOLAX) packet 17 g  17 g Oral Daily [name]., MD      . thiamine (VITAMIN B-1) tablet 100 mg  100 mg Oral Daily [name]., MD   100 mg at 01/11/18 7544    Musculoskeletal: Strength & Muscle Tone: within normal limits Gait & Station: UTA since patient is lying in bed. Patient leans: N/A  Psychiatric Specialty Exam: Physical Exam  Nursing note and vitals  reviewed. Constitutional: He is oriented to person, place, and time. He appears well-developed and well-nourished.  HENT:  Head: Normocephalic and atraumatic.  Neck: Normal range of motion.  Respiratory: Effort normal.  Musculoskeletal: Normal range of motion.  Neurological: He is alert and oriented to person, place, and time.  Psychiatric: His speech is normal and behavior is normal. Judgment and thought content normal. Cognition and memory are normal. He exhibits a depressed mood.    Review of Systems  Cardiovascular: Negative for chest pain.  Gastrointestinal: Negative for abdominal pain, constipation, diarrhea, nausea and vomiting.  Musculoskeletal:       Mild bilateral leg pain.  Psychiatric/Behavioral: Positive for depression and substance abuse. Negative for hallucinations and suicidal ideas. The patient is not nervous/anxious and does not have insomnia.   All other systems reviewed and are negative.   Blood pressure (!) 136/94, pulse 61, temperature 98.5 F (36.9 C), temperature source Oral, resp. rate 17, height '5\' 11"'  (1.803 m), weight 80.6 kg, SpO2 99 %.Body mass index is 24.78 kg/m.  General Appearance: Fairly Groomed, middle aged, African American male, wearing paper hospital scrubs who is lying in bed. NAD.   Eye Contact:  Good  Speech:  Clear and Coherent and Normal Rate  Volume:  Normal  Mood:  Depressed  Affect:  Congruent and Tearful at times.   Thought Process:  Goal Directed, Linear and Descriptions of Associations: Intact  Orientation:  Full (Time, Place, and Person)  Thought Content:  Logical  Suicidal Thoughts:  No  Homicidal Thoughts:  No  Memory:  Immediate;   Good Recent;   Good Remote;   Good  Judgement:  Good  Insight:  Good  Psychomotor Activity:  Normal  Concentration:  Concentration: Good and Attention Span: Good  Recall:  Good  Fund of Knowledge:  Good  Language:  Good  Akathisia:  No  Handed:  Right  AIMS (if indicated):   N/A  Assets:   Communication Skills Desire for Improvement Financial Resources/Insurance Housing Intimacy Physical Health Resilience Social Support  ADL's:  Intact  Cognition:  WNL  Sleep:   Okay   Assessment:  HAROUT SCHEURICH is a 47 y.o. male who was admitted with rhabdomyolysis. He was initially admitted to [facility] for SI in the setting of relapsing on cocaine. He reports recent depressed mood in the setting of multiple psychosocial stressors. He denies SI, HI or AVH. He reports multiple protective  factors against suicide. He is able to safety plan. He is willing to follow up with an outpatient psychiatrist and therapist. He does not warrant inpatient psychiatric hospitalization at this time.   Treatment Plan Summary:  -Please have SW provide patient with resources for an outpatient psychiatrist and therapist. He may benefit from marital counseling as well.  -Patient may benefit from starting an antidepressant for his mood such as an SSRI. He is unsure if he will be returning to [place] so a medication will not be started at this time due to concern that patient will be lost to follow up. -Patient declines substance abuse resources at this time.  -Patient is psychiatrically cleared. Psychiatry will sign off on patient at this time. Please consult psychiatry again as needed.    Disposition: No evidence of imminent risk to self or others at present.   Patient does not meet criteria for psychiatric inpatient admission.  Faythe Dingwall, DO 01/11/2018 11:51 AM

## 2018-01-11 NOTE — Progress Notes (Addendum)
PROGRESS NOTE    Calvin Newman  ZOX:[id number] DOB: 03-Aug-1970 DOA: 01/09/2018 PCP: Patient, No Pcp Per   Brief Narrative: Patient is a 47 year old male with past medical history significant for depression, polysubstance abuse who presented with myalgias, suicidal ideation and depression.  He was initially admitted to behavioral health with suicidal ideation but sent back to the emergency department for further work-up of rhabdomyolysis.He has been admitted for the management of increased CK level from rhabdomyolysis.  Assessment & Plan:   Principal Problem:   Rhabdomyolysis Active Problems:   Depression   Suicidal ideation  Rhabdomyolysis: Most likely triggered by excessive walking in the setting of cocaine abuse. Patient reported that he was walking continously for 4 days. Patient says that he was encouraged by his uncle to take cocaine.  Presented with acute kidney injury which has resolved with IV fluids.  CK level more than 50,000s on presentation. CK improving but slow. Continue IV fluids.  Daily monitoring of CK level and renal function.  AKI: Resolved with IV fluids  Elevated liver enzymes: Associated with rhabdomyolysis.  Acute hepatitis panel negative.  Depression/suicidal ideation: Initially sent to the behavioral health department after presenting himself with depression and suicidal ideation. Currently in IVC due to suicidal ideation.  Corporate investment banker.  Will request for psychiatric reevaluation today because he denies any suicidal ideation at present and wants to go home.  This will help [place] for discharge plan when he is medically ready.Will continue sitter until psychiatrically clears him.Also needs to start on antidepressants.  Hypoglycemia: Continue sliding scale insulin.  HbA1C is 6.4.  Polysubstance abuse: Counseled for cessation.  He says he is motivated to quit.    Smoker: Smokes.  Counseled for cessation.  Continue nicotine patch  Hypokalemia:  Supplemented   DVT prophylaxis: Lovenox Code Status: Full Family Communication: Called wife on her cell phone and updated yesterday Disposition Plan: Home versus psychiatry department after psychiatry evaluation and improvement in the CK level. He may need IV fluids for 1 or 2 days more.  We donot need to  put him here  until the CK level comes to normal because his kidney function is intact.If psych thinks that he needs to admitted to behavioral health, will discharge him to behavioral health after  improvement  in the CK level otherwise discharge to home   Consultants: None  Procedures: None  Antimicrobials: None  Subjective: Patient seen and examined the bedside this morning.  Remains comfortable.  Denies any complaints.  Wants to go home.  Denies any suicidal ideation during my evaluation.  Objective: Vitals:   01/10/18 0537 01/10/18 1451 01/10/18 2019 01/11/18 0603  BP: 109/77 (!) 146/82 136/80 (!) 136/94  Pulse: 72 69 64 61  Resp: 15 20 18 17   Temp: 98.1 F (36.7 C) 98.7 F (37.1 C) 98.6 F (37 C) 98.5 F (36.9 C)  TempSrc: Oral Oral Oral Oral  SpO2: 93% 100% 98% 99%  Weight: 80.6 kg     Height:        Intake/Output Summary (Last 24 hours) at 01/11/2018 1055 Last data filed at 01/11/2018 0600 Gross per 24 hour  Intake 3746.22 ml  Output -  Net 3746.22 ml   Filed Weights   01/10/18 0537  Weight: 80.6 kg    Examination:  General exam: Appears calm and comfortable ,Not in distress,average built HEENT:PERRL,Oral mucosa moist, Ear/Nose normal on gross exam Respiratory system: Bilateral equal air entry, normal vesicular breath sounds, no wheezes or crackles  Cardiovascular system: S1 &  S2 heard, RRR. No JVD, murmurs, rubs, gallops or clicks. No pedal edema. Gastrointestinal system: Abdomen is nondistended, soft and nontender. No organomegaly or masses felt. Normal bowel sounds heard. Central nervous system: Alert and oriented. No focal neurological  deficits. Extremities: No edema, no clubbing ,no cyanosis, distal peripheral pulses palpable. Skin: No rashes, lesions or ulcers,no icterus ,no pallor MSK: Normal muscle bulk,tone ,power Psychiatry: Judgement and insight appear normal. Mood & affect appropriate.     Data Reviewed: I have personally reviewed following labs and imaging studies  CBC: Recent Labs  Lab 01/08/18 2008 01/10/18 0601  WBC 7.2 4.8  HGB 14.7 12.3*  HCT 43.4 38.0*  MCV 86.6 90.0  PLT 299 236   Basic Metabolic Panel: Recent Labs  Lab 01/08/18 2008 01/09/18 1004 01/10/18 0601 01/11/18 0559  NA 135 140 139 144  K 3.5 3.4* 3.9 3.9  CL 103 104 109 112*  CO2 23 26 22 26   GLUCOSE 169* 117* 99 103*  BUN 29* 22* 14 10  CREATININE 1.55* 1.43* 1.14 1.00  CALCIUM 8.4* 8.3* 7.6* 8.3*  MG  --  2.1  --   --    GFR: Estimated Creatinine Clearance: 95.2 mL/min (by C-G formula based on SCr of 1 mg/dL). Liver Function Tests: Recent Labs  Lab 01/08/18 2008 01/09/18 1004 01/10/18 0601 01/11/18 0559  AST 743* 868* 637* 773*  ALT 93* 113* 97* 117*  ALKPHOS 40 37* 27* 33*  BILITOT 1.2 0.9 0.7 0.6  PROT 6.5 6.0* 4.7* 5.3*  ALBUMIN 3.3* 3.1* 2.3* 2.8*   No results for input(s): LIPASE, AMYLASE in the last 168 hours. No results for input(s): AMMONIA in the last 168 hours. Coagulation Profile: Recent Labs  Lab 01/09/18 1004  INR 0.84   Cardiac Enzymes: Recent Labs  Lab 01/09/18 1004 01/10/18 0601 01/11/18 0559  CKTOTAL >50,000* 45,558* 16,109*   BNP (last 3 results) No results for input(s): PROBNP in the last 8760 hours. HbA1C: Recent Labs    01/10/18 0601  HGBA1C 6.4*   CBG: No results for input(s): GLUCAP in the last 168 hours. Lipid Profile: No results for input(s): CHOL, HDL, LDLCALC, TRIG, CHOLHDL, LDLDIRECT in the last 72 hours. Thyroid Function Tests: No results for input(s): TSH, T4TOTAL, FREET4, T3FREE, THYROIDAB in the last 72 hours. Anemia Panel: No results for input(s):  VITAMINB12, FOLATE, FERRITIN, TIBC, IRON, RETICCTPCT in the last 72 hours. Sepsis Labs: No results for input(s): PROCALCITON, LATICACIDVEN in the last 168 hours.  No results found for this or any previous visit (from the past 240 hour(s)).       Radiology Studies: No results found.      Scheduled Meds: . enoxaparin (LOVENOX) injection  40 mg Subcutaneous Q24H  . folic acid  1 mg Oral Daily  . multivitamin with minerals  1 tablet Oral Daily  . nicotine  21 mg Transdermal Daily  . polyethylene glycol  17 g Oral Daily  . thiamine  100 mg Oral Daily   Continuous Infusions: . sodium chloride 200 mL/hr at 01/11/18 0747     LOS: 2 days    Time spent:25 mins. More than 50% of that time was spent in counseling and/or coordination of care.      Burnadette Pop, MD [facility] Pager [phone]  If 7PM-7AM, please contact night-coverage [email or web address] Password [email or web address] 01/11/2018, 10:55 AM

## 2018-01-12 DIAGNOSIS — R45851 Suicidal ideations: Secondary | ICD-10-CM

## 2018-01-12 LAB — COMPREHENSIVE METABOLIC PANEL
ALT: 127 U/L — ABNORMAL HIGH (ref 0–44)
AST: 759 U/L — AB (ref 15–41)
Albumin: 2.6 g/dL — ABNORMAL LOW (ref 3.5–5.0)
Alkaline Phosphatase: 32 U/L — ABNORMAL LOW (ref 38–126)
Anion gap: 6 (ref 5–15)
BUN: 9 mg/dL (ref 6–20)
CHLORIDE: 109 mmol/L (ref 98–111)
CO2: 27 mmol/L (ref 22–32)
Calcium: 8 mg/dL — ABNORMAL LOW (ref 8.9–10.3)
Creatinine, Ser: 0.98 mg/dL (ref 0.61–1.24)
Glucose, Bld: 106 mg/dL — ABNORMAL HIGH (ref 70–99)
POTASSIUM: 3.9 mmol/L (ref 3.5–5.1)
SODIUM: 142 mmol/L (ref 135–145)
Total Bilirubin: 0.6 mg/dL (ref 0.3–1.2)
Total Protein: 5.5 g/dL — ABNORMAL LOW (ref 6.5–8.1)

## 2018-01-12 LAB — CK: CK TOTAL: 30403 U/L — AB (ref 49–397)

## 2018-01-12 MED ORDER — TRAZODONE HCL 50 MG PO TABS
50.0000 mg | ORAL_TABLET | Freq: Every evening | ORAL | Status: DC | PRN
  Filled 2018-01-12: qty 1

## 2018-01-12 MED ORDER — METHOCARBAMOL 500 MG PO TABS
750.0000 mg | ORAL_TABLET | Freq: Three times a day (TID) | ORAL | Status: DC | PRN
  Administered 2018-01-12 (×2): 750 mg via ORAL
  Filled 2018-01-12 (×2): qty 2

## 2018-01-12 NOTE — Progress Notes (Signed)
PROGRESS NOTE [facility]   Calvin Newman   VWU:[id number] DOB: June 09, 1970  DOA: 01/09/2018 PCP: Patient, No Pcp Per   Brief Narrative:  Calvin Newman 47 year old male with past medical history significant for depression, polysubstance abuse who presented to the emergency department complaining of myalgias, suicidal idea and depression.  Patient is initially was deemed to be admitted to behavioral health unit due to suicidal ideas however upon lab work-up in the ED was found to have significantly elevated CK levels.  Patient was admitted with working diagnosis of rhabdomyolysis.  Subjective: Patient seen and examined, today is complaining of sore muscles all over his body.  Also neck tightness along with back spasms.  CK trending down.  Remains afebrile.  Assessment & Plan: Rhabdomyolysis  Felt to be secondary to excessive walking in setting of cocaine abuse.  CK levels on presentation more than 50,000.  Being treated with aggressive IV fluids.  CK levels slowly improving.  Renal function stable.  Encourage oral hydration.  If CK and LFTs continue to trend down possible discharge in the next 24 to 48 hours.  AKI In setting of rhabdomyolysis, creatinine has normalized after aggressive IV fluid. Continue to monitor renal function.  Elevated liver enzymes Due to rhabdomyolysis, hepatitis panel negative.  LFTs slowly trending down.  Continue to monitor LFTs.  Depression with suicidal ideas Patient denies any suicidal idea at the moment.  Psychiatry has evaluated patient and deemed stable to follow-up as an outpatient.  Per psych no need for inpatient psych admission.  Continue to monitor  Polysubstance abuse Cessation discussed  Smoker Cessation discussed  DVT prophylaxis: Lovenox Code Status: Full code Family Communication: None at bedside Disposition Plan: Home in 1-2 more days.  Consultants:   Psych   Procedures:   None   Antimicrobials:  None     Objective: Vitals:   01/11/18 1411 01/11/18 2029 01/12/18 0514 01/12/18 0601  BP: (!) 151/92 (!) 159/92  (!) 150/95  Pulse: 63 74  (!) 58  Resp: 20 20  18   Temp: 98.9 F (37.2 C) 98.5 F (36.9 C)  98.1 F (36.7 C)  TempSrc: Oral   Oral  SpO2: 100% 100%  100%  Weight:   79.5 kg   Height:        Intake/Output Summary (Last 24 hours) at 01/12/2018 1310 Last data filed at 01/12/2018 1033 Gross per 24 hour  Intake 5074.76 ml  Output -  Net 5074.76 ml   Filed Weights   01/10/18 0537 01/12/18 0514  Weight: 80.6 kg 79.5 kg    Examination:  General exam: Appears calm and comfortable  Respiratory system: Clear to auscultation. No wheezes,crackle or rhonchi Cardiovascular system: S1 & S2 heard, RRR. No JVD, murmurs, rubs or gallops Gastrointestinal system: Abdomen is nondistended, soft and nontender. No organomegaly or masses felt.  Central nervous system: Alert and oriented. No focal neurological deficits. Extremities: No pedal edema. Symmetric, strength 5/5   Skin: No rashes, lesions or ulcers   Data Reviewed: I have personally reviewed following labs and imaging studies  CBC: Recent Labs  Lab 01/08/18 2008 01/10/18 0601  WBC 7.2 4.8  HGB 14.7 12.3*  HCT 43.4 38.0*  MCV 86.6 90.0  PLT 299 236   Basic Metabolic Panel: Recent Labs  Lab 01/08/18 2008 01/09/18 1004 01/10/18 0601 01/11/18 0559 01/12/18 0558  NA 135 140 139 144 142  K 3.5 3.4* 3.9 3.9 3.9  CL 103 104 109 112* 109  CO2 23 26 22  26  27  GLUCOSE 169* 117* 99 103* 106*  BUN 29* 22* 14 10 9   CREATININE 1.55* 1.43* 1.14 1.00 0.98  CALCIUM 8.4* 8.3* 7.6* 8.3* 8.0*  MG  --  2.1  --   --   --    GFR: Estimated Creatinine Clearance: 97.1 mL/min (by C-G formula based on SCr of 0.98 mg/dL). Liver Function Tests: Recent Labs  Lab 01/08/18 2008 01/09/18 1004 01/10/18 0601 01/11/18 0559 01/12/18 0558  AST 743* 868* 637* 773* 759*  ALT 93* 113* 97* 117* 127*  ALKPHOS 40 37* 27* 33* 32*   BILITOT 1.2 0.9 0.7 0.6 0.6  PROT 6.5 6.0* 4.7* 5.3* 5.5*  ALBUMIN 3.3* 3.1* 2.3* 2.8* 2.6*   No results for input(s): LIPASE, AMYLASE in the last 168 hours. No results for input(s): AMMONIA in the last 168 hours. Coagulation Profile: Recent Labs  Lab 01/09/18 1004  INR 0.84   Cardiac Enzymes: Recent Labs  Lab 01/09/18 1004 01/10/18 0601 01/11/18 0559 01/12/18 0558  CKTOTAL >50,000* 45,558* 69,629* 30,403*   BNP (last 3 results) No results for input(s): PROBNP in the last 8760 hours. HbA1C: Recent Labs    01/10/18 0601  HGBA1C 6.4*   CBG: No results for input(s): GLUCAP in the last 168 hours. Lipid Profile: No results for input(s): CHOL, HDL, LDLCALC, TRIG, CHOLHDL, LDLDIRECT in the last 72 hours. Thyroid Function Tests: No results for input(s): TSH, T4TOTAL, FREET4, T3FREE, THYROIDAB in the last 72 hours. Anemia Panel: No results for input(s): VITAMINB12, FOLATE, FERRITIN, TIBC, IRON, RETICCTPCT in the last 72 hours. Sepsis Labs: No results for input(s): PROCALCITON, LATICACIDVEN in the last 168 hours.  No results found for this or any previous visit (from the past 240 hour(s)).    Radiology Studies: No results found.    Scheduled Meds: . enoxaparin (LOVENOX) injection  40 mg Subcutaneous Q24H  . folic acid  1 mg Oral Daily  . multivitamin with minerals  1 tablet Oral Daily  . nicotine  21 mg Transdermal Daily  . polyethylene glycol  17 g Oral Daily  . thiamine  100 mg Oral Daily   Continuous Infusions: . sodium chloride 250 mL/hr at 01/12/18 1140     LOS: 3 days    Time spent: Total of 25 minutes spent with pt, greater than 50% of which was spent in discussion of  treatment, counseling and coordination of care    Latrelle Dodrill, MD Pager: Text Page via [email or web address]   If 7PM-7AM, please contact night-coverage [email or web address] 01/12/2018, 1:10 PM   Note - This record has been created using [employer]. Chart creation errors have been sought,  but may not always have been located. Such creation errors do not reflect on the standard of medical care.

## 2018-01-12 NOTE — Progress Notes (Signed)
CSW provided patient with information to [facility] [place] for out patient counseling services on AVS. Per Dr. Sharma Covert, patient declined any substance abuse services at this time.   CSW signing off.   Stacy Gardner, LCSW Clinical Social Worker  [facility] [employer]  [phone]

## 2018-01-13 LAB — COMPREHENSIVE METABOLIC PANEL
ALT: 113 U/L — AB (ref 0–44)
ANION GAP: 4 — AB (ref 5–15)
AST: 528 U/L — ABNORMAL HIGH (ref 15–41)
Albumin: 2.6 g/dL — ABNORMAL LOW (ref 3.5–5.0)
Alkaline Phosphatase: 32 U/L — ABNORMAL LOW (ref 38–126)
BILIRUBIN TOTAL: 0.6 mg/dL (ref 0.3–1.2)
BUN: 12 mg/dL (ref 6–20)
CHLORIDE: 110 mmol/L (ref 98–111)
CO2: 29 mmol/L (ref 22–32)
Calcium: 8.3 mg/dL — ABNORMAL LOW (ref 8.9–10.3)
Creatinine, Ser: 1.02 mg/dL (ref 0.61–1.24)
GFR calc Af Amer: >60 mL/min (ref >60)
GFR calc non Af Amer: >60 mL/min (ref >60)
GLUCOSE: 91 mg/dL (ref 70–99)
POTASSIUM: 4 mmol/L (ref 3.5–5.1)
Sodium: 143 mmol/L (ref 135–145)
TOTAL PROTEIN: 5.3 g/dL — AB (ref 6.5–8.1)

## 2018-01-13 LAB — CK: CK TOTAL: 16776 U/L — AB (ref 49–397)

## 2018-01-13 MED ORDER — METHOCARBAMOL 750 MG PO TABS: 750.0000 mg | ORAL_TABLET | Freq: Three times a day (TID) | ORAL | 0 refills | Status: DC | PRN

## 2018-01-13 MED ORDER — ESCITALOPRAM OXALATE 5 MG PO TABS: 5.0000 mg | ORAL_TABLET | Freq: Every day | ORAL | 0 refills | Status: DC

## 2018-01-13 MED ORDER — NICOTINE 21 MG/24HR TD PT24: 21.0000 mg | MEDICATED_PATCH | Freq: Every day | TRANSDERMAL | 0 refills | Status: DC

## 2018-01-13 MED ORDER — ESCITALOPRAM OXALATE 10 MG PO TABS
5.0000 mg | ORAL_TABLET | Freq: Every day | ORAL | Status: DC
  Administered 2018-01-13: 5 mg via ORAL
  Filled 2018-01-13: qty 1

## 2018-01-13 NOTE — Discharge Summary (Signed)
Physician Discharge Summary  Calvin Newman  ZOX:[id number]  DOB: September 15, 1970  DOA: 01/09/2018 PCP: Patient, No Pcp Per  Admit date: 01/09/2018 Discharge date: 01/13/2018  Admitted From: Home  Disposition: Home   Recommendations for Outpatient Follow-up:  1. Establish care with PCP within 1 week  2. Please obtain CMP, CK and CBC in 1 week to monitor liver function, hgb and CK levels  3. Psychology/psych referral  4. Monitor BP in outpatient setting   Discharge Condition: Stable  CODE STATUS: Full Code  Diet recommendation: Regular  Brief/Interim Summary: For full details see H&P/Progress note, but in brief, Calvin Newman is a 47 year old male with past medical history significant for depression, polysubstance abuse who presented to the emergency department complaining of myalgias, suicidal idea and depression.  Patient is initially was deemed to be admitted to behavioral health unit due to suicidal ideas however upon lab work-up in the ED was found to have significantly elevated CK levels.  Patient was admitted with working diagnosis of rhabdomyolysis.  Subjective: Patient seen and examined, feeling much better today, denies body aches.  CKD continues to trend down.  Remains afebrile.  Denies SI/HI.  Discharge Diagnoses/Hospital Course:  Rhabdomyolysis  Felt to be secondary to excessive walking in setting of cocaine abuse.  CK levels on presentation more than 50,000.  Treated with aggressive hydration, CK levels trending down.  Upon discharge assisting K.  Renal function stable, LFTs trending down, given patient clinical improvement and labs trending down patient was deemed stable for discharge.  Encourage aggressive oral hydration at home.  Counseled on avoiding cocaine.  Advised to increase physical activity slowly and avoid extreme exercise for at least a week.  Information for primary care physician given to patient to establish care and follow-up on lab work-up.  AKI In  setting of rhabdomyolysis, creatinine has normalized after aggressive IV fluid. Due to renal functioning 1 week  Elevated liver enzymes Due to rhabdomyolysis, hepatitis panel negative.    LFTs trending down, monitor liver function in 1 week.   Depression with suicidal ideas Patient was evaluated by psychiatrist and deemed stable to follow-up as an outpatient.  Per psych no need for inpatient psych admission.   Patient has been started on low-dose Lexapro and advised to establish care with primary care physician for further monitoring.  Psych/psychology referral recommended.  On the day of discharge denies SI/HI.  Polysubstance abuse Cessation discussed  Smoker I have discussed smoking cessation with patient who is interested on quitting.  Will prescribe nicotine patch as patient reported doing well on it.  Elevated BP without diagnosis of HTN  Likely from aggressive IVF, anxiety and hospital related. No medications were given at this time. Follow up and monitor BP as outpatient.   On the day of the discharge the patient's vitals were stable, and no other acute medical condition were reported by patient. the patient was felt safe to be discharge to home.   Discharge Instructions  You were cared for by a hospitalist during your hospital stay. If you have any questions about your discharge medications or the care you received while you were in the hospital after you are discharged, you can call the unit and asked to speak with the hospitalist on call if the hospitalist that took care of you is not available. Once you are discharged, your primary care physician will handle any further medical issues. Please note that NO REFILLS for any discharge medications will be authorized once you are discharged, as  it is imperative that you return to your primary care physician (or establish a relationship with a primary care physician if you do not have one) for your aftercare needs so that they can  reassess your need for medications and monitor your lab values.  Discharge Instructions    Call MD for:  difficulty breathing, headache or visual disturbances   Complete by:  As directed    Call MD for:  extreme fatigue   Complete by:  As directed    Call MD for:  hives   Complete by:  As directed    Call MD for:  persistant dizziness or light-headedness   Complete by:  As directed    Call MD for:  persistant nausea and vomiting   Complete by:  As directed    Call MD for:  redness, tenderness, or signs of infection (pain, swelling, redness, odor or green/yellow discharge around incision site)   Complete by:  As directed    Call MD for:  severe uncontrolled pain   Complete by:  As directed    Call MD for:  temperature >100.4   Complete by:  As directed    Diet general   Complete by:  As directed    Increase activity slowly   Complete by:  As directed      Allergies as of 01/13/2018   No Known Allergies     Medication List    TAKE these medications   escitalopram 5 MG tablet Commonly known as:  LEXAPRO Take 1 tablet (5 mg total) by mouth daily.   methocarbamol 750 MG tablet Commonly known as:  ROBAXIN Take 1 tablet (750 mg total) by mouth every 8 (eight) hours as needed for muscle spasms.   nicotine 21 mg/24hr patch Commonly known as:  NICODERM CQ - dosed in mg/24 hours Place 1 patch (21 mg total) onto the skin daily. Start taking on:  01/14/2018      Follow-up Information    [facility]. Schedule an appointment as soon as possible for a visit in 1 week(s).   Why:  call [phone] and make an apoointment for a hospital follow up visit Contact information: [address] [place] [address] [phone]       [facility] [employer]. Schedule an appointment as soon as possible for a visit.   Specialty:  Professional Counselor Why:  Please follow up  Contact information: [facility]  [place] [address] [place] [address] [phone]          No Known Allergies  Consultations:  None    Procedures/Studies: No results found.   Discharge Exam: Vitals:   01/12/18 2100 01/13/18 0639  BP: (!) 165/86 (!) 141/83  Pulse: 63 65  Resp: 18 16  Temp: 98.8 F (37.1 C) 97.7 F (36.5 C)  SpO2: 96% 100%   Vitals:   01/12/18 0601 01/12/18 1339 01/12/18 2100 01/13/18 0639  BP: (!) 150/95 (!) 141/106 (!) 165/86 (!) 141/83  Pulse: (!) 58 64 63 65  Resp: 18 18 18 16   Temp: 98.1 F (36.7 C) 98.4 F (36.9 C) 98.8 F (37.1 C) 97.7 F (36.5 C)  TempSrc: Oral Oral Oral Oral  SpO2: 100% 100% 96% 100%  Weight:      Height:        General: Pt is alert, awake, not in acute distress Cardiovascular: RRR, S1/S2 +, no rubs, no gallops Respiratory: CTA bilaterally, no wheezing,  no rhonchi Abdominal: Soft, NT, ND, bowel sounds + Extremities: no edema, no cyanosis  The results of significant diagnostics from this hospitalization (including imaging, microbiology, ancillary and laboratory) are listed below for reference.     Microbiology: No results found for this or any previous visit (from the past 240 hour(s)).   Labs: BNP (last 3 results) No results for input(s): BNP in the last 8760 hours. Basic Metabolic Panel: Recent Labs  Lab 01/09/18 1004 01/10/18 0601 01/11/18 0559 01/12/18 0558 01/13/18 0553  NA 140 139 144 142 143  K 3.4* 3.9 3.9 3.9 4.0  CL 104 109 112* 109 110  CO2 26 22 26 27 29   GLUCOSE 117* 99 103* 106* 91  BUN 22* 14 10 9 12   CREATININE 1.43* 1.14 1.00 0.98 1.02  CALCIUM 8.3* 7.6* 8.3* 8.0* 8.3*  MG 2.1  --   --   --   --    Liver Function Tests: Recent Labs  Lab 01/09/18 1004 01/10/18 0601 01/11/18 0559 01/12/18 0558 01/13/18 0553  AST 868* 637* 773* 759* 528*  ALT 113* 97* 117* 127* 113*  ALKPHOS 37* 27* 33* 32* 32*  BILITOT 0.9 0.7 0.6 0.6 0.6  PROT 6.0* 4.7* 5.3* 5.5* 5.3*  ALBUMIN 3.1* 2.3* 2.8* 2.6* 2.6*    No results for input(s): LIPASE, AMYLASE in the last 168 hours. No results for input(s): AMMONIA in the last 168 hours. CBC: Recent Labs  Lab 01/08/18 2008 01/10/18 0601  WBC 7.2 4.8  HGB 14.7 12.3*  HCT 43.4 38.0*  MCV 86.6 90.0  PLT 299 236   Cardiac Enzymes: Recent Labs  Lab 01/09/18 1004 01/10/18 0601 01/11/18 0559 01/12/18 0558 01/13/18 0553  CKTOTAL >50,000* 45,558* 56,387* 30,403* 16,776*   BNP: Invalid input(s): POCBNP CBG: No results for input(s): GLUCAP in the last 168 hours. D-Dimer No results for input(s): DDIMER in the last 72 hours. Hgb A1c No results for input(s): HGBA1C in the last 72 hours. Lipid Profile No results for input(s): CHOL, HDL, LDLCALC, TRIG, CHOLHDL, LDLDIRECT in the last 72 hours. Thyroid function studies No results for input(s): TSH, T4TOTAL, T3FREE, THYROIDAB in the last 72 hours.  Invalid input(s): FREET3 Anemia work up No results for input(s): VITAMINB12, FOLATE, FERRITIN, TIBC, IRON, RETICCTPCT in the last 72 hours. Urinalysis    Component Value Date/Time   COLORURINE YELLOW 01/09/2018 1607   APPEARANCEUR CLEAR 01/09/2018 1607   LABSPEC 1.014 01/09/2018 1607   PHURINE 5.0 01/09/2018 1607   GLUCOSEU NEGATIVE 01/09/2018 1607   HGBUR LARGE (A) 01/09/2018 1607   HGBUR small 01/08/2009 0000   BILIRUBINUR NEGATIVE 01/09/2018 1607   KETONESUR NEGATIVE 01/09/2018 1607   PROTEINUR NEGATIVE 01/09/2018 1607   UROBILINOGEN 0.2 01/08/2009 0000   NITRITE NEGATIVE 01/09/2018 1607   LEUKOCYTESUR NEGATIVE 01/09/2018 1607   Sepsis Labs Invalid input(s): PROCALCITONIN,  WBC,  LACTICIDVEN Microbiology No results found for this or any previous visit (from the past 240 hour(s)).   Time coordinating discharge: 32 minutes  SIGNED:  Latrelle Dodrill, MD  [facility] 01/13/2018, 11:17 AM  Pager please text page via  [email or web address]  Note - This record has been created using [employer]. Chart creation errors have been sought,  but may not always have been located. Such creation errors do not reflect on the standard of medical care.

## 2018-01-13 NOTE — Care Management Note (Signed)
Case Management Note  Patient Details  Name: Calvin Newman MRN: [id number] Date of Birth: 06-21-1970  Subjective/Objective:                    Action/Plan: [facility]. Pt is aware and agreed.   Expected Discharge Date:  01/13/18               Expected Discharge Plan:  Home/Self Care  In-House Referral:     Discharge planning Services  CM Consult, Follow-up appt scheduled, [facility]  Post Acute Care Choice:    Choice offered to:     DME Arranged:    DME Agency:     HH Arranged:    HH Agency:     Status of Service:  Completed, signed off  If discussed at [employer], dates discussed:    Additional CommentsGeni Bers, RN 01/13/2018, 11:34 AM

## 2018-01-13 NOTE — Progress Notes (Signed)
Appointment with [facility] Patient [facility] Oct 25th at 1:00 PM made. Pt agreed with this time.

## 2018-01-20 ENCOUNTER — Telehealth: Payer: Self-pay

## 2018-01-20 NOTE — Telephone Encounter (Signed)
Left a vm about appointment time tomorrow at 1pm.

## 2018-01-21 ENCOUNTER — Ambulatory Visit: Payer: Self-pay | Admitting: Family Medicine

## 2018-10-14 ENCOUNTER — Emergency Department (HOSPITAL_COMMUNITY)
Admission: EM | Admit: 2018-10-14 | Discharge: 2018-10-15 | Disposition: A | Discharge status: Home / Self Care | Payer: Self-pay | Attending: Emergency Medicine | Admitting: Emergency Medicine

## 2018-10-14 ENCOUNTER — Other Ambulatory Visit: Payer: Self-pay

## 2018-10-14 DIAGNOSIS — S0101XA Laceration without foreign body of scalp, initial encounter: Secondary | ICD-10-CM

## 2018-10-14 DIAGNOSIS — Y939 Activity, unspecified: Secondary | ICD-10-CM | POA: Insufficient documentation

## 2018-10-14 DIAGNOSIS — Y999 Unspecified external cause status: Secondary | ICD-10-CM | POA: Insufficient documentation

## 2018-10-14 DIAGNOSIS — Z23 Encounter for immunization: Secondary | ICD-10-CM | POA: Insufficient documentation

## 2018-10-14 DIAGNOSIS — S0511XA Contusion of eyeball and orbital tissues, right eye, initial encounter: Secondary | ICD-10-CM

## 2018-10-14 DIAGNOSIS — Z79899 Other long term (current) drug therapy: Secondary | ICD-10-CM | POA: Insufficient documentation

## 2018-10-14 DIAGNOSIS — S0181XA Laceration without foreign body of other part of head, initial encounter: Secondary | ICD-10-CM

## 2018-10-14 DIAGNOSIS — F17210 Nicotine dependence, cigarettes, uncomplicated: Secondary | ICD-10-CM | POA: Insufficient documentation

## 2018-10-14 DIAGNOSIS — Y929 Unspecified place or not applicable: Secondary | ICD-10-CM | POA: Insufficient documentation

## 2018-10-14 DIAGNOSIS — R030 Elevated blood-pressure reading, without diagnosis of hypertension: Secondary | ICD-10-CM

## 2018-10-14 MED ORDER — ACETAMINOPHEN 325 MG PO TABS
650.0000 mg | ORAL_TABLET | Freq: Once | ORAL | Status: AC
  Administered 2018-10-15: 650 mg via ORAL
  Filled 2018-10-14: qty 2

## 2018-10-14 MED ORDER — LIDOCAINE-EPINEPHRINE (PF) 2 %-1:200000 IJ SOLN
10.0000 mL | Freq: Once | INTRAMUSCULAR | Status: AC
  Administered 2018-10-14: 10 mL
  Filled 2018-10-14: qty 20

## 2018-10-14 MED ORDER — TETANUS-DIPHTH-ACELL PERTUSSIS 5-2.5-18.5 LF-MCG/0.5 IM SUSP
0.5000 mL | Freq: Once | INTRAMUSCULAR | Status: AC
  Administered 2018-10-14: 0.5 mL via INTRAMUSCULAR
  Filled 2018-10-14: qty 0.5
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
## 2018-10-15 ENCOUNTER — Emergency Department (HOSPITAL_COMMUNITY): Payer: Self-pay

## 2018-10-15 NOTE — ED Notes (Signed)
Patient verbalizes understanding of discharge instructions. Opportunity for questioning and answers were provided. Armband removed by staff, pt discharged from ED.  

## 2018-10-15 NOTE — Discharge Instructions (Addendum)
Staples should be removed ni 7-10 days, sutures should be removed in 3-5 days. That can be done here, at an urgent care center, or at a doctor"s office.  Apply ice for thirty minutes at a time, 3-4 times a day, as needed.  Take acetaminophen , ibuprofen, or naproxen as needed for pain.  Your blood pressure was high today. Please have it rechecked several times over the next 1-2 weeks. If it stays high, you will need to start medication to treat it.

## 2018-10-21 ENCOUNTER — Ambulatory Visit (HOSPITAL_COMMUNITY)
Admission: EM | Admit: 2018-10-21 | Discharge: 2018-10-21 | Disposition: A | Discharge status: Home / Self Care | Payer: Self-pay

## 2018-10-21 ENCOUNTER — Other Ambulatory Visit: Payer: Self-pay

## 2018-10-21 DIAGNOSIS — Z4802 Encounter for removal of sutures: Secondary | ICD-10-CM

## 2018-10-21 NOTE — ED Triage Notes (Signed)
Pt presents to have 9 staples and 7 sutures removed from right side of forehead.

## 2019-03-06 ENCOUNTER — Other Ambulatory Visit: Payer: Self-pay

## 2019-03-06 ENCOUNTER — Emergency Department (HOSPITAL_COMMUNITY)
Admission: EM | Admit: 2019-03-06 | Discharge: 2019-03-07 | Disposition: A | Discharge status: Home / Self Care | Payer: Self-pay | Attending: Emergency Medicine | Admitting: Emergency Medicine

## 2019-03-06 ENCOUNTER — Encounter (HOSPITAL_COMMUNITY): Payer: Self-pay | Admitting: Emergency Medicine

## 2019-03-06 DIAGNOSIS — Z79899 Other long term (current) drug therapy: Secondary | ICD-10-CM | POA: Insufficient documentation

## 2019-03-06 DIAGNOSIS — R55 Syncope and collapse: Secondary | ICD-10-CM | POA: Insufficient documentation

## 2019-03-06 DIAGNOSIS — F17210 Nicotine dependence, cigarettes, uncomplicated: Secondary | ICD-10-CM | POA: Insufficient documentation

## 2019-03-06 NOTE — ED Triage Notes (Addendum)
Pt presents to ED from home BIB [facility]. Pt reports syncope after exertion (walking up stairs). Pt was sitting down in chair so no fall or trauma. Pt reportedly became pale, diaphoretic before syncope and lost control of bowels and bladder during syncope. EMS found pt initially hypotensive, 80 palp. EMS gave 500 mL NS. Per EMS Pt did have some elevation in EKG but EKG was faxed and OKed by EDP.

## 2019-03-07 LAB — CBC WITH DIFFERENTIAL/PLATELET
Abs Immature Granulocytes: 0.02 10*3/uL (ref 0.00–0.07)
Basophils Absolute: 0 10*3/uL (ref 0.0–0.1)
Basophils Relative: 0 %
Eosinophils Absolute: 0 10*3/uL (ref 0.0–0.5)
Eosinophils Relative: 0 %
HCT: 38.7 % — ABNORMAL LOW (ref 39.0–52.0)
Hemoglobin: 13.1 g/dL (ref 13.0–17.0)
Immature Granulocytes: 0 %
Lymphocytes Relative: 14 %
Lymphs Abs: 1.1 10*3/uL (ref 0.7–4.0)
MCH: 29.6 pg (ref 26.0–34.0)
MCHC: 33.9 g/dL (ref 30.0–36.0)
MCV: 87.6 fL (ref 80.0–100.0)
Monocytes Absolute: 0.8 10*3/uL (ref 0.1–1.0)
Monocytes Relative: 10 %
Neutro Abs: 5.9 10*3/uL (ref 1.7–7.7)
Neutrophils Relative %: 76 %
Platelets: 273 10*3/uL (ref 150–400)
RBC: 4.42 MIL/uL (ref 4.22–5.81)
RDW: 13.6 % (ref 11.5–15.5)
WBC: 7.8 10*3/uL (ref 4.0–10.5)
nRBC: 0 % (ref 0.0–0.2)

## 2019-03-07 LAB — BASIC METABOLIC PANEL
Anion gap: 7 (ref 5–15)
BUN: 18 mg/dL (ref 6–20)
CO2: 26 mmol/L (ref 22–32)
Calcium: 9.1 mg/dL (ref 8.9–10.3)
Chloride: 106 mmol/L (ref 98–111)
Creatinine, Ser: 1.11 mg/dL (ref 0.61–1.24)
GFR calc Af Amer: >60 mL/min (ref >60)
GFR calc non Af Amer: >60 mL/min (ref >60)
Glucose, Bld: 90 mg/dL (ref 70–99)
Potassium: 3.6 mmol/L (ref 3.5–5.1)
Sodium: 139 mmol/L (ref 135–145)

## 2019-03-07 NOTE — ED Provider Notes (Signed)
[place] EMERGENCY DEPARTMENT Provider Note   CSN: [id number] Arrival date & time: 03/06/19  2335     History   Chief Complaint Chief Complaint  Patient presents with  . Loss of Consciousness    HPI Calvin Newman is a 48 y.o. male.     Patient is a 48 year old male with history of anxiety.  He presents today for evaluation of syncope.  Patient states that he went to the closet to get down games to play during his wife's birthday.  He then sat down on the couch and started to feel hot and sweaty.  He apparently had 1 episode of emesis followed by a brief, 5-second episode of syncope.  Patient woke shortly afterward and felt flushed, but was alert and oriented.  Patient states he now feels well and wants to go home.  He denies any recent illness preceding this episode.  There is also no reported seizure-like activity and no loss of bowel or bladder continence.  He denies any chest pain or palpitations during this episode.  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  He reports no recent exertional symptoms.  The history is provided by the patient.  Loss of Consciousness Episode history:  Single Most recent episode:  Today Duration:  5 seconds Progression:  Resolved Chronicity:  New Relieved by:  Nothing Worsened by:  Nothing Ineffective treatments:  None tried   History reviewed. No pertinent past medical history.  Patient Active Problem List   Diagnosis Date Noted  . Anxiety and depression 01/11/2018  . Suicidal ideation 01/11/2018  . MDD (major depressive disorder), severe ([place]) 01/09/2018  . Rhabdomyolysis 01/09/2018  . CHLAMYDIAL INFECTION 01/09/2009  . TOBACCO ABUSE 01/08/2009  . HEMOCCULT POSITIVE STOOL 01/08/2009  . ACID REFLUX DISEASE 11/26/2008    Past Surgical History:  Procedure Laterality Date  . r hand surgery     fracture 4th and 5th metacarpal        Home Medications    Prior to Admission  medications   Medication Sig Start Date End Date Taking? Authorizing Provider  escitalopram (LEXAPRO) 5 MG tablet Take 1 tablet (5 mg total) by mouth daily. 01/13/18   Doreatha Lew, MD    Family History Family History  Problem Relation Age of Onset  . Cancer Father     Social History Social History   Tobacco Use  . Smoking status: Current Every Day Smoker    Packs/day: 0.50    Types: Cigarettes  . Smokeless tobacco: Never Used  Substance Use Topics  . Alcohol use: Yes    Comment: occasional  . Drug use: Yes    Types: Cocaine     Allergies   Patient has no known allergies.   Review of Systems Review of Systems  Cardiovascular: Positive for syncope.  All other systems reviewed and are negative.    Physical Exam Updated Vital Signs BP 118/79 (BP Location: Left Arm)   Pulse 68   Temp 98.2 F (36.8 C) (Oral)   Resp 16   Ht 5\' 11"  (1.803 m)   Wt 79.8 kg   SpO2 100%   BMI 24.55 kg/m   Physical Exam Vitals signs and nursing note reviewed.  Constitutional:      General: He is not in acute distress.    Appearance: He is well-developed. He is not diaphoretic.  HENT:     Head: Normocephalic and atraumatic.  Eyes:     Extraocular Movements:  Extraocular movements intact.     Pupils: Pupils are equal, round, and reactive to light.  Neck:     Musculoskeletal: Normal range of motion and neck supple.  Cardiovascular:     Rate and Rhythm: Normal rate and regular rhythm.     Heart sounds: No murmur. No friction rub.  Pulmonary:     Effort: Pulmonary effort is normal. No respiratory distress.     Breath sounds: Normal breath sounds. No wheezing or rales.  Abdominal:     General: Bowel sounds are normal. There is no distension.     Palpations: Abdomen is soft.     Tenderness: There is no abdominal tenderness.  Musculoskeletal: Normal range of motion.  Skin:    General: Skin is warm and dry.  Neurological:     General: No focal deficit present.     Mental  Status: He is alert and oriented to person, place, and time.     Cranial Nerves: No cranial nerve deficit.     Sensory: No sensory deficit.     Motor: No weakness.     Coordination: Coordination normal.      ED Treatments / Results  Labs (all labs ordered are listed, but only abnormal results are displayed) Labs Reviewed  BASIC METABOLIC PANEL  CBC WITH DIFFERENTIAL/PLATELET    EKG ED ECG REPORT   Date: 03/07/2019  Rate: 77  Rhythm: normal sinus rhythm  QRS Axis: normal  Intervals: normal  ST/T Wave abnormalities: normal  Conduction Disutrbances:none  Narrative Interpretation:   Old EKG Reviewed: none available  I have personally reviewed the EKG tracing and agree with the computerized printout as noted.   Radiology No results found.  Procedures Procedures (including critical care time)  Medications Ordered in ED Medications - No data to display   Initial Impression / Assessment and Plan / ED Course  I have reviewed the triage vital signs and the nursing notes.  Pertinent labs & imaging results that were available during my care of the patient were reviewed by me and considered in my medical decision making (see chart for details).  Patient presents here for evaluation of syncope as described in the HPI.  I suspect the etiology of this episode is vasovagal.  It seemed to happen just after he vomited.  Patient's vital signs are stable and work-up here is unremarkable.  There is no anemia or electrolyte abnormality.  His EKG is a normal sinus rhythm and he has maintained sinus rhythm throughout.  At this point, I feel as though discharge is  appropriate.  He is to follow-up as needed and return if he worsens.   Final Clinical Impressions(s) / ED Diagnoses   Final diagnoses:  None    ED Discharge Orders    None       Geoffery Lyons, MD 03/07/19 [phone]

## 2019-03-07 NOTE — Discharge Instructions (Addendum)
Drink plenty of fluids and get plenty of rest.  Return to the Emergency Department if you experience any new issues.

## 2020-11-20 ENCOUNTER — Other Ambulatory Visit (HOSPITAL_COMMUNITY)
Admission: EM | Admit: 2020-11-20 | Discharge: 2020-11-21 | Disposition: A | Discharge status: Home / Self Care | Payer: No Payment, Other | Attending: Psychiatry | Admitting: Psychiatry

## 2020-11-20 ENCOUNTER — Encounter (HOSPITAL_COMMUNITY): Payer: Self-pay | Admitting: Psychiatry

## 2020-11-20 ENCOUNTER — Other Ambulatory Visit: Payer: Self-pay

## 2020-11-20 DIAGNOSIS — Z20822 Contact with and (suspected) exposure to covid-19: Secondary | ICD-10-CM | POA: Insufficient documentation

## 2020-11-20 DIAGNOSIS — R45851 Suicidal ideations: Secondary | ICD-10-CM | POA: Diagnosis not present

## 2020-11-20 DIAGNOSIS — F332 Major depressive disorder, recurrent severe without psychotic features: Secondary | ICD-10-CM | POA: Diagnosis not present

## 2020-11-20 LAB — CBC WITH DIFFERENTIAL/PLATELET
Abs Immature Granulocytes: 0.01 10*3/uL (ref 0.00–0.07)
Basophils Absolute: 0 10*3/uL (ref 0.0–0.1)
Basophils Relative: 1 %
Eosinophils Absolute: 0.2 10*3/uL (ref 0.0–0.5)
Eosinophils Relative: 4 %
HCT: 41.7 % (ref 39.0–52.0)
Hemoglobin: 13.7 g/dL (ref 13.0–17.0)
Immature Granulocytes: 0 %
Lymphocytes Relative: 31 %
Lymphs Abs: 1.4 10*3/uL (ref 0.7–4.0)
MCH: 29.1 pg (ref 26.0–34.0)
MCHC: 32.9 g/dL (ref 30.0–36.0)
MCV: 88.7 fL (ref 80.0–100.0)
Monocytes Absolute: 0.6 10*3/uL (ref 0.1–1.0)
Monocytes Relative: 13 %
Neutro Abs: 2.3 10*3/uL (ref 1.7–7.7)
Neutrophils Relative %: 51 %
Platelets: 330 10*3/uL (ref 150–400)
RBC: 4.7 MIL/uL (ref 4.22–5.81)
RDW: 14.5 % (ref 11.5–15.5)
WBC: 4.5 10*3/uL (ref 4.0–10.5)
nRBC: 0 % (ref 0.0–0.2)

## 2020-11-20 LAB — RESP PANEL BY RT-PCR (FLU A&B, COVID) ARPGX2
Influenza A by PCR: NEGATIVE
Influenza B by PCR: NEGATIVE
SARS Coronavirus 2 by RT PCR: NEGATIVE

## 2020-11-20 LAB — ETHANOL: Alcohol, Ethyl (B): <10 mg/dL (ref <10)

## 2020-11-20 LAB — POCT URINE DRUG SCREEN - MANUAL ENTRY (I-SCREEN)
POC Amphetamine UR: NOT DETECTED
POC Buprenorphine (BUP): NOT DETECTED
POC Cocaine UR: NOT DETECTED
POC Marijuana UR: NOT DETECTED
POC Methadone UR: NOT DETECTED
POC Methamphetamine UR: NOT DETECTED
POC Morphine: NOT DETECTED
POC Oxazepam (BZO): NOT DETECTED
POC Oxycodone UR: NOT DETECTED
POC Secobarbital (BAR): NOT DETECTED

## 2020-11-20 LAB — LIPID PANEL
Cholesterol: 163 mg/dL (ref 0–200)
HDL: 61 mg/dL (ref >40)
LDL Cholesterol: 81 mg/dL (ref 0–99)
Total CHOL/HDL Ratio: 2.7 RATIO
Triglycerides: 106 mg/dL (ref <150)
VLDL: 21 mg/dL (ref 0–40)

## 2020-11-20 LAB — COMPREHENSIVE METABOLIC PANEL
ALT: 11 U/L (ref 0–44)
AST: 16 U/L (ref 15–41)
Albumin: 3.7 g/dL (ref 3.5–5.0)
Alkaline Phosphatase: 53 U/L (ref 38–126)
Anion gap: 6 (ref 5–15)
BUN: 19 mg/dL (ref 6–20)
CO2: 31 mmol/L (ref 22–32)
Calcium: 9.4 mg/dL (ref 8.9–10.3)
Chloride: 104 mmol/L (ref 98–111)
Creatinine, Ser: 1.34 mg/dL — ABNORMAL HIGH (ref 0.61–1.24)
GFR, Estimated: >60 mL/min (ref >60)
Glucose, Bld: 78 mg/dL (ref 70–99)
Potassium: 4.5 mmol/L (ref 3.5–5.1)
Sodium: 141 mmol/L (ref 135–145)
Total Bilirubin: 0.6 mg/dL (ref 0.3–1.2)
Total Protein: 7.4 g/dL (ref 6.5–8.1)

## 2020-11-20 LAB — URINALYSIS, COMPLETE (UACMP) WITH MICROSCOPIC
Bilirubin Urine: NEGATIVE
Glucose, UA: NEGATIVE mg/dL
Ketones, ur: NEGATIVE mg/dL
Leukocytes,Ua: NEGATIVE
Nitrite: NEGATIVE
Protein, ur: NEGATIVE mg/dL
Specific Gravity, Urine: 1.027 (ref 1.005–1.030)
pH: 5 (ref 5.0–8.0)

## 2020-11-20 LAB — TSH: TSH: 0.977 u[IU]/mL (ref 0.350–4.500)

## 2020-11-20 LAB — MAGNESIUM: Magnesium: 2.2 mg/dL (ref 1.7–2.4)

## 2020-11-20 LAB — HEMOGLOBIN A1C
Hgb A1c MFr Bld: 6.9 % — ABNORMAL HIGH (ref 4.8–5.6)
Mean Plasma Glucose: 151.33 mg/dL

## 2020-11-20 LAB — POC SARS CORONAVIRUS 2 AG -  ED: SARS Coronavirus 2 Ag: NEGATIVE

## 2020-11-20 LAB — POC SARS CORONAVIRUS 2 AG: SARSCOV2ONAVIRUS 2 AG: NEGATIVE

## 2020-11-20 MED ORDER — METHOCARBAMOL 500 MG PO TABS
500.0000 mg | ORAL_TABLET | Freq: Three times a day (TID) | ORAL | Status: DC | PRN
Start: 2020-11-20 — End: 2020-11-21

## 2020-11-20 MED ORDER — LOPERAMIDE HCL 2 MG PO CAPS: 2.0000 mg | ORAL_CAPSULE | ORAL | Status: DC | PRN

## 2020-11-20 MED ORDER — ESCITALOPRAM OXALATE 10 MG PO TABS
10.0000 mg | ORAL_TABLET | Freq: Once | ORAL | Status: AC
  Administered 2020-11-20: 10 mg via ORAL
  Filled 2020-11-20: qty 1

## 2020-11-20 MED ORDER — MAGNESIUM HYDROXIDE 400 MG/5ML PO SUSP: 30.0000 mL | Freq: Every day | ORAL | Status: DC | PRN

## 2020-11-20 MED ORDER — HYDROXYZINE HCL 25 MG PO TABS: 25.0000 mg | ORAL_TABLET | Freq: Four times a day (QID) | ORAL | Status: DC | PRN

## 2020-11-20 MED ORDER — DICYCLOMINE HCL 20 MG PO TABS
20.0000 mg | ORAL_TABLET | Freq: Four times a day (QID) | ORAL | Status: DC | PRN
Start: 2020-11-20 — End: 2020-11-21

## 2020-11-20 MED ORDER — ALUM & MAG HYDROXIDE-SIMETH 200-200-20 MG/5ML PO SUSP: 30.0000 mL | ORAL | Status: DC | PRN

## 2020-11-20 MED ORDER — NAPROXEN 500 MG PO TABS: 500.0000 mg | ORAL_TABLET | Freq: Two times a day (BID) | ORAL | Status: DC | PRN

## 2020-11-20 MED ORDER — ONDANSETRON 4 MG PO TBDP: 4.0000 mg | ORAL_TABLET | Freq: Four times a day (QID) | ORAL | Status: DC | PRN

## 2020-11-20 MED ORDER — ACETAMINOPHEN 325 MG PO TABS: 650.0000 mg | ORAL_TABLET | Freq: Four times a day (QID) | ORAL | Status: DC | PRN

## 2020-11-20 MED ORDER — TRAZODONE HCL 50 MG PO TABS: 50.0000 mg | ORAL_TABLET | Freq: Every evening | ORAL | Status: DC | PRN
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
## 2020-11-20 NOTE — ED Provider Notes (Signed)
[facility] Admission Suicide Risk Assessment   Nursing information obtained from:   Chart Demographic factors:   African American male, low socioeconomic status Current Mental Status:   See admission H&P Loss Factors:   Loss of significant relationship, loss of property, low socioeconomic status Historical Factors:   Impulsivity, substance abuse, MDD, previous SI Risk Reduction Factors:   Obligation to family, positive social support from son, religious believes  Total Time spent with patient: 45 minutes Principal Problem: <principal problem not specified> Diagnosis:  Active Problems:   MDD (major depressive disorder), recurrent severe, without psychosis ([place])  Subjective Data: Calvin Newman, 50 y.o., male patient seen face to face by this provider, consulted with Dr. Serafina Mitchell; and chart reviewed on 11/20/20.  On evaluation Calvin Newman reports two weeks ago he came home from [place] after visiting his sister who has cancer and his mother who lives in a nursing home to find he was locked out of his his home.  Reports his wife had locked the house and left, leaving all of his personal belongings outside.  Reports he did have a truck, but he let his son borrow it and now it is impounded.  States he made a comment that he would be better off if he was not here and was encouraged to call the mobile crisis line.  States the mobile crisis line was concerned and had him involuntarily committed and brought to [facility] for an assessment.   During evaluation Calvin Newman is in sitting position in no acute distress.  He makes good eye contact.  He is disheveled, clothes are dirty and has a foul odor. His speech is clear, coherent, normal rate and tone.  He is tearful at times.  He is alert/oriented x 4; and calm/cooperative. His mood is depressed with congruent affect.  States he feels hopeless and worthless.  Reports he has an appetite but has been unable to purchase food to eat.  States he sleeps  little due to sleeping in his truck.  His thought process is coherent and relevant; There is no indication that he is currently responding to internal/external stimuli or experiencing delusional thought content; and he has denied homicidal ideation, psychosis, and paranoia.  Endorses passive suicidal ideations.  States he does not have intent, or a plan, but does have access to a flip knife.  He admits he did say to the police when being asked about suicidal ideations that "I could cut my wrist".  He denies auditory and visual hallucinations. Patient has remained calm throughout assessment and has answered questions appropriately.     Reports he works at [employer] and he is currently homeless.  States when he receives his check he either gives money a way or he buys "crack cocaine".  States he has done crack cocaine every day for the past 2 weeks, last use was yesterday.  States, "I use the cocaine because it helps me forget all that I am going through".  Denies alcohol and all other illegal substances.  States he has had one inpatient psychiatric admission at [facility] for [facility] and polysubstance use in 2019.  He has 4 adult children, but he only speaks to his youngest son.  Denies any health concerns.  Denies any withdrawal symptoms at this time.   Patient declines inpatient psychiatric admission, states his time at [facility] was not a good experience.  Discussed facility based crisis unit with patient patient agrees to be voluntarily admitted.  Criteria to uphold  the involuntary commitment is not met, IVC will be rescinded.   Continued Clinical Symptoms:    The "Alcohol Use Disorders Identification Test", Guidelines for Use in Primary Care, Second Edition.  World Pharmacologist [facility]). Score between 0-7:  no or low risk or alcohol related problems. Score between 8-15:  moderate risk of alcohol related problems. Score between 16-19:  high risk of alcohol related problems. Score 20 or above:  warrants further  diagnostic evaluation for alcohol dependence and treatment.   CLINICAL FACTORS:   Depression:   Comorbid alcohol abuse/dependence Hopelessness Impulsivity Alcohol/Substance Abuse/Dependencies   Musculoskeletal: Strength & Muscle Tone: within normal limits Gait & Station: normal Patient leans: N/A  Psychiatric Specialty Exam:  Presentation  General Appearance: Disheveled  Eye Contact:Good  Speech:Clear and Coherent; Normal Rate  Speech Volume:Normal  Handedness:Right   Mood and Affect  Mood:Depressed; Worthless; Hopeless  Affect:Congruent; Depressed; Tearful   Thought Process  Thought Processes:Coherent  Descriptions of Associations:Intact  Orientation:Full (Time, Place and Person)  Thought Content:Logical  History of Schizophrenia/Schizoaffective disorder:No  Duration of Psychotic Symptoms:No data recorded Hallucinations:Hallucinations: None  Ideas of Reference:None  Suicidal Thoughts:Suicidal Thoughts: Yes, Passive SI Passive Intent and/or Plan: Without Intent; Without Plan; With Access to Means  Homicidal Thoughts:Homicidal Thoughts: No   Sensorium  Memory:Recent Good  Judgment:Fair  Insight:Good   Executive Functions  Concentration:Good  Attention Span:Good  [place] of Knowledge:Good  Language:Good   Psychomotor Activity  Psychomotor Activity:Psychomotor Activity: Normal   Assets  Assets:Communication Skills; Desire for Improvement; Physical Health; Resilience; Social Support; Vocational/Educational   Sleep  Sleep:Sleep: Poor    Physical Exam: Physical Exam ROS Blood pressure 119/84, pulse 91, temperature 98.7 F (37.1 C), resp. rate 16, SpO2 99 %. There is no height or weight on file to calculate BMI.   COGNITIVE FEATURES THAT CONTRIBUTE TO RISK:  None    SUICIDE RISK:   Mild:  Suicidal ideation of limited frequency, intensity, duration, and specificity.  There are no identifiable plans, no associated  intent, mild dysphoria and related symptoms, good self-control (both objective and subjective assessment), few other risk factors, and identifiable protective factors, including available and accessible social support.  PLAN OF CARE:   Based on my evaluation the patient does not appear to have an emergency medical condition.   Criteria for IVC not met, IVC rescinded by Dr. Serafina Mitchell.   Patient meets criteria for treatment in the [facility] , admit patient to facility base crisis unit.   Lab work ordered: CBC with differential, CMP, TSH, A1c, lipid panel, magnesium, UDS, U/A EKG ordered.    Lexapro 10 mg daily for depression initiated.    Clinical opiate withdrawal protocol initiated  I certify that inpatient services furnished can reasonably be expected to improve the patient's condition.   Revonda Humphrey, NP 11/20/2020, 6:27 PM

## 2020-11-20 NOTE — ED Provider Notes (Signed)
Behavioral Health Admission H&P [facility] & OBS)  Date: 11/20/20 Patient Name: Calvin Newman MRN: [id number] Chief Complaint:  Chief Complaint  Patient presents with   Adjustment Disorder    Pt endorsed passive SI due to wife leaving him about two weeks ago.  As a result of wife leaving him, he lost his home and his truck.  Pt is despondent.  Called mobile crisis.  Per [facility] counselor, Pt threatened to kill himself with a knife.  Pt denied.  Pt is under IVC.      Diagnoses:  Final diagnoses:  Severe episode of recurrent major depressive disorder, without psychotic features ([place])    HPI:  Calvin Newman. Calvin Newman, 50 yr old male patient presented to [facility] as a walk in accompanied by [employer] by IVC with complaints of " I have lost everything".  [name], 50 y.o., male patient seen face to face by this provider, consulted with Dr. Serafina Mitchell; and chart reviewed on 11/20/20.  On evaluation JASKIRAT SCHWIEGER reports two weeks ago he came home from [place] after visiting his sister who has cancer and his mother who lives in a nursing home to find he was locked out of his his home.  Reports his wife had locked the house and left, leaving all of his personal belongings outside.  Reports he did have a truck, but he let his son borrow it and now it is impounded.  States he made a comment that he would be better off if he was not here and was encouraged to call the mobile crisis line.  States the mobile crisis line was concerned and had him involuntarily committed and brought to [facility] for an assessment.  During evaluation Calvin Newman is in sitting position in no acute distress.  He makes good eye contact.  He is disheveled, clothes are dirty and has a foul odor. His speech is clear, coherent, normal rate and tone.  He is tearful at times.  He is alert/oriented x 4; and calm/cooperative. His mood is depressed with congruent affect.  States he feels hopeless and worthless.  Reports he has an  appetite but has been unable to purchase food to eat.  States he sleeps little due to sleeping in his truck.  His thought process is coherent and relevant; There is no indication that he is currently responding to internal/external stimuli or experiencing delusional thought content; and he has denied homicidal ideation, psychosis, and paranoia.  Endorses passive suicidal ideations.  States he does not have intent, or a plan, but does have access to a flip knife.  He admits he did say to the police when being asked about suicidal ideations that "I could cut my wrist".  He denies auditory and visual hallucinations. Patient has remained calm throughout assessment and has answered questions appropriately.    Reports he works at [employer] and he is currently homeless.  States when he receives his check he either gives money a way or he buys "crack cocaine".  States he has done crack cocaine every day for the past 2 weeks, last use was yesterday.  States, "I use the cocaine because it helps me forget all that I am going through".  Denies alcohol and all other illegal substances.  States he has had one inpatient psychiatric admission at [facility] for [facility] and polysubstance use in 2019.  He has 4 adult children, but he only speaks to his youngest son.  Denies any health concerns.  Denies any  withdrawal symptoms at this time.  Patient declines inpatient psychiatric admission, states his time at [facility] was not a good experience.  Discussed facility based crisis unit with patient patient agrees to be voluntarily admitted.  Criteria to uphold the involuntary commitment is not met, IVC will be rescinded.     PHQ 2-9:  Flowsheet Row ED from 11/20/2020 in [facility]  Thoughts that you would be better off dead, or of hurting yourself in some way Several days  PHQ-9 Total Score 9       [place] ED from 11/20/2020 in [facility] Admission (Discharged) from 01/09/2018  in [place] ED from 01/08/2018 in [place] CATEGORY Low Risk High Risk High Risk        Total Time spent with patient: 45 minutes  Musculoskeletal  Strength & Muscle Tone: within normal limits Gait & Station: normal Patient leans: N/A  Psychiatric Specialty Exam  Presentation General Appearance: Disheveled  Eye Contact:Good  Speech:Clear and Coherent; Normal Rate  Speech Volume:Normal  Handedness:Right   Mood and Affect  Mood:Depressed; Worthless; Hopeless  Affect:Congruent; Depressed; Tearful   Thought Process  Thought Processes:Coherent  Descriptions of Associations:Intact  Orientation:Full (Time, Place and Person)  Thought Content:Logical  Diagnosis of Schizophrenia or Schizoaffective disorder in past: No   Hallucinations:Hallucinations: None  Ideas of Reference:None  Suicidal Thoughts:Suicidal Thoughts: Yes, Passive SI Passive Intent and/or Plan: Without Intent; Without Plan; With Access to Means  Homicidal Thoughts:Homicidal Thoughts: No   Sensorium  Memory:Recent Good  Judgment:Fair  Insight:Good   Executive Functions  Concentration:Good  Attention Span:Good  [place] of Knowledge:Good  Language:Good   Psychomotor Activity  Psychomotor Activity:Psychomotor Activity: Normal   Assets  Assets:Communication Skills; Desire for Improvement; Physical Health; Resilience; Social Support; Vocational/Educational   Sleep  Sleep:Sleep: Poor   No data recorded  Physical Exam Vitals and nursing note reviewed.  Constitutional:      General: He is not in acute distress.    Appearance: He is well-developed. He is not ill-appearing.  HENT:     Head: Normocephalic.  Eyes:     General:        Right eye: No discharge.        Left eye: No discharge.     Conjunctiva/sclera: Conjunctivae normal.  Cardiovascular:     Rate and Rhythm:  Normal rate.     Heart sounds: No murmur heard. Pulmonary:     Effort: Pulmonary effort is normal. No respiratory distress.  Musculoskeletal:        General: Normal range of motion.     Cervical back: Normal range of motion.  Skin:    General: Skin is warm and dry.  Neurological:     Mental Status: He is alert and oriented to person, place, and time.  Psychiatric:        Attention and Perception: Attention and perception normal.        Mood and Affect: Mood is depressed. Affect is tearful.        Speech: Speech normal.        Behavior: Behavior normal. Behavior is cooperative.        Thought Content: Thought content includes suicidal ideation. Thought content does not include suicidal plan.        Cognition and Memory: Cognition normal.        Judgment: Judgment is impulsive.   Review of Systems  Constitutional: Negative.  Negative for fever.  HENT: Negative.  Negative for hearing loss.   Eyes: Negative.   Respiratory: Negative.  Negative for cough.   Cardiovascular: Negative.   Musculoskeletal: Negative.   Skin: Negative.   Neurological: Negative.   Psychiatric/Behavioral:  Positive for depression, substance abuse and suicidal ideas.    Blood pressure 119/84, pulse 91, temperature 98.7 F (37.1 C), resp. rate 16, SpO2 99 %. There is no height or weight on file to calculate BMI.  Past Psychiatric History: MDD, polysubstance abuse   Is the patient at risk to self? Yes  Has the patient been a risk to self in the past 6 months? Yes .    Has the patient been a risk to self within the distant past? Yes   Is the patient a risk to others? No   Has the patient been a risk to others in the past 6 months? No   Has the patient been a risk to others within the distant past? No   Past Medical History: No past medical history on file.  Past Surgical History:  Procedure Laterality Date   r hand surgery     fracture 4th and 5th metacarpal    Family History:  Family History   Problem Relation Age of Onset   Cancer Father     Social History:  Social History   Socioeconomic History   Marital status: Married    Spouse name: Not on file   Number of children: Not on file   Years of education: Not on file   Highest education level: Some college, no degree  Occupational History   Not on file  Tobacco Use   Smoking status: Every Day    Packs/day: 0.50    Types: Cigarettes   Smokeless tobacco: Never  Vaping Use   Vaping Use: Never used  Substance and Sexual Activity   Alcohol use: Yes    Comment: occasional   Drug use: Yes    Types: Cocaine   Sexual activity: Yes  Other Topics Concern   Not on file  Social History Narrative   Not on file   Social Determinants of Health   Financial Resource Strain: Not on file  Food Insecurity: Not on file  Transportation Needs: Not on file  Physical Activity: Not on file  Stress: Not on file  Social Connections: Not on file  Intimate Partner Violence: Not on file    SDOH:  SDOH Screenings   Alcohol Screen: Not on file  Depression (PHQ2-9): Medium Risk   PHQ-2 Score: 9  Financial Resource Strain: Not on file  Food Insecurity: Not on file  Housing: Not on file  Physical Activity: Not on file  Social Connections: Not on file  Stress: Not on file  Tobacco Use: Not on file  Transportation Needs: Not on file    Last Labs:  Admission on 11/20/2020  Component Date Value Ref Range Status   SARS Coronavirus 2 by RT PCR 11/20/2020 NEGATIVE  NEGATIVE Final   Comment: (NOTE) SARS-CoV-2 target nucleic acids are NOT DETECTED.  The SARS-CoV-2 RNA is generally detectable in upper respiratory specimens during the acute phase of infection. The lowest concentration of SARS-CoV-2 viral copies this assay can detect is 138 copies/mL. A negative result does not preclude SARS-Cov-2 infection and should not be used as the sole basis for treatment or other patient management decisions. A negative result may occur  with  improper specimen collection/handling, submission of specimen other than nasopharyngeal swab,  presence of viral mutation(s) within the areas targeted by this assay, and inadequate number of viral copies(<138 copies/mL). A negative result must be combined with clinical observations, patient history, and epidemiological information. The expected result is Negative.  Fact Sheet for Patients:  EntrepreneurPulse.com.au  Fact Sheet for Healthcare Providers:  IncredibleEmployment.be  This test is no                          t yet approved or cleared by the [place] FDA and  has been authorized for detection and/or diagnosis of SARS-CoV-2 by FDA under an Emergency Use Authorization (EUA). This EUA will remain  in effect (meaning this test can be used) for the duration of the COVID-19 declaration under Section 564(b)(1) of the Act, 21 U.S.C.section 360bbb-3(b)(1), unless the authorization is terminated  or revoked sooner.       Influenza A by PCR 11/20/2020 NEGATIVE  NEGATIVE Final   Influenza B by PCR 11/20/2020 NEGATIVE  NEGATIVE Final   Comment: (NOTE) The Xpert Xpress SARS-CoV-2/FLU/RSV plus assay is intended as an aid in the diagnosis of influenza from Nasopharyngeal swab specimens and should not be used as a sole basis for treatment. Nasal washings and aspirates are unacceptable for Xpert Xpress SARS-CoV-2/FLU/RSV testing.  Fact Sheet for Patients: EntrepreneurPulse.com.au  Fact Sheet for Healthcare Providers: IncredibleEmployment.be  This test is not yet approved or cleared by the [place] FDA and has been authorized for detection and/or diagnosis of SARS-CoV-2 by FDA under an Emergency Use Authorization (EUA). This EUA will remain in effect (meaning this test can be used) for the duration of the COVID-19 declaration under Section 564(b)(1) of the Act, 21 U.S.C. section 360bbb-3(b)(1),  unless the authorization is terminated or revoked.  Performed at [facility], [place] [address], [place], [place] [address]    SARS Coronavirus 2 Ag 11/20/2020 Negative  Negative Final   Color, Urine 11/20/2020 YELLOW  YELLOW Final   APPearance 11/20/2020 HAZY (A) CLEAR Final   Specific Gravity, Urine 11/20/2020 1.027  1.005 - 1.030 Final   pH 11/20/2020 5.0  5.0 - 8.0 Final   Glucose, UA 11/20/2020 NEGATIVE  NEGATIVE mg/dL Final   Hgb urine dipstick 11/20/2020 MODERATE (A) NEGATIVE Final   Bilirubin Urine 11/20/2020 NEGATIVE  NEGATIVE Final   Ketones, ur 11/20/2020 NEGATIVE  NEGATIVE mg/dL Final   Protein, ur 11/20/2020 NEGATIVE  NEGATIVE mg/dL Final   Nitrite 11/20/2020 NEGATIVE  NEGATIVE Final   Leukocytes,Ua 11/20/2020 NEGATIVE  NEGATIVE Final   RBC / HPF 11/20/2020 11-20  0 - 5 RBC/hpf Final   WBC, UA 11/20/2020 0-5  0 - 5 WBC/hpf Final   Bacteria, UA 11/20/2020 RARE (A) NONE SEEN Final   Squamous Epithelial / LPF 11/20/2020 0-5  0 - 5 Final   Mucus 11/20/2020 PRESENT   Final   Ca Oxalate Crys, UA 11/20/2020 PRESENT   Final   Performed at [facility], [place] [address] [place], [place] [address]   POC Amphetamine UR 11/20/2020 None Detected  NONE DETECTED (Cut Off Level 1000 ng/mL) Final   POC Secobarbital (BAR) 11/20/2020 None Detected  NONE DETECTED (Cut Off Level 300 ng/mL) Final   POC Buprenorphine (BUP) 11/20/2020 None Detected  NONE DETECTED (Cut Off Level 10 ng/mL) Final   POC Oxazepam (BZO) 11/20/2020 None Detected  NONE DETECTED (Cut Off Level 300 ng/mL) Final   POC Cocaine UR 11/20/2020 None Detected  NONE DETECTED (Cut Off Level 300 ng/mL) Final  POC Methamphetamine UR 11/20/2020 None Detected  NONE DETECTED (Cut Off Level 1000 ng/mL) Final   POC Morphine 11/20/2020 None Detected  NONE DETECTED (Cut Off Level 300 ng/mL) Final   POC Oxycodone UR 11/20/2020 None Detected  NONE DETECTED (Cut Off Level 100 ng/mL) Final   POC Methadone UR 11/20/2020 None Detected   NONE DETECTED (Cut Off Level 300 ng/mL) Final   POC Marijuana UR 11/20/2020 None Detected  NONE DETECTED (Cut Off Level 50 ng/mL) Final   WBC 11/20/2020 4.5  4.0 - 10.5 K/uL Final   RBC 11/20/2020 4.70  4.22 - 5.81 MIL/uL Final   Hemoglobin 11/20/2020 13.7  13.0 - 17.0 g/dL Final   HCT 11/20/2020 41.7  39.0 - 52.0 % Final   MCV 11/20/2020 88.7  80.0 - 100.0 fL Final   MCH 11/20/2020 29.1  26.0 - 34.0 pg Final   MCHC 11/20/2020 32.9  30.0 - 36.0 g/dL Final   RDW 11/20/2020 14.5  11.5 - 15.5 % Final   Platelets 11/20/2020 330  150 - 400 K/uL Final   nRBC 11/20/2020 0.0  0.0 - 0.2 % Final   Neutrophils Relative % 11/20/2020 51  % Final   Neutro Abs 11/20/2020 2.3  1.7 - 7.7 K/uL Final   Lymphocytes Relative 11/20/2020 31  % Final   Lymphs Abs 11/20/2020 1.4  0.7 - 4.0 K/uL Final   Monocytes Relative 11/20/2020 13  % Final   Monocytes Absolute 11/20/2020 0.6  0.1 - 1.0 K/uL Final   Eosinophils Relative 11/20/2020 4  % Final   Eosinophils Absolute 11/20/2020 0.2  0.0 - 0.5 K/uL Final   Basophils Relative 11/20/2020 1  % Final   Basophils Absolute 11/20/2020 0.0  0.0 - 0.1 K/uL Final   Immature Granulocytes 11/20/2020 0  % Final   Abs Immature Granulocytes 11/20/2020 0.01  0.00 - 0.07 K/uL Final   Performed at [facility], [place] [address], [place], [place] [address]   Sodium 11/20/2020 141  135 - 145 mmol/L Final   Potassium 11/20/2020 4.5  3.5 - 5.1 mmol/L Final   Chloride 11/20/2020 104  98 - 111 mmol/L Final   CO2 11/20/2020 31  22 - 32 mmol/L Final   Glucose, Bld 11/20/2020 78  70 - 99 mg/dL Final   Glucose reference range applies only to samples taken after fasting for at least 8 hours.   BUN 11/20/2020 19  6 - 20 mg/dL Final   Creatinine, Ser 11/20/2020 1.34 (A) 0.61 - 1.24 mg/dL Final   Calcium 11/20/2020 9.4  8.9 - 10.3 mg/dL Final   Total Protein 11/20/2020 7.4  6.5 - 8.1 g/dL Final   Albumin 11/20/2020 3.7  3.5 - 5.0 g/dL Final   AST 11/20/2020 16  15 - 41 U/L Final    ALT 11/20/2020 11  0 - 44 U/L Final   Alkaline Phosphatase 11/20/2020 53  38 - 126 U/L Final   Total Bilirubin 11/20/2020 0.6  0.3 - 1.2 mg/dL Final   GFR, Estimated 11/20/2020 >60  >60 mL/min Final   Comment: (NOTE) Calculated using the CKD-EPI Creatinine Equation (2021)    Anion gap 11/20/2020 6  5 - 15 Final   Performed at [facility] [address] [place], [place] [address]   Hgb A1c MFr Bld 11/20/2020 6.9 (A) 4.8 - 5.6 % Final   Comment: (NOTE) Pre diabetes:          5.7%-6.4%  Diabetes:              >  6.4%  Glycemic control for   <7.0% adults with diabetes    Mean Plasma Glucose 11/20/2020 151.33  mg/dL Final   Performed at [facility] [address] [place], [place] [address]   Magnesium 11/20/2020 2.2  1.7 - 2.4 mg/dL Final   Performed at [facility] [address], [place], [place] [address]   Alcohol, Ethyl (B) 11/20/2020 <10  <10 mg/dL Final   Comment: (NOTE) Lowest detectable limit for serum alcohol is 10 mg/dL.  For medical purposes only. Performed at [facility], [place] [address] [place], [place] [address]    Cholesterol 11/20/2020 163  0 - 200 mg/dL Final   Triglycerides 11/20/2020 106  <150 mg/dL Final   HDL 11/20/2020 61  >40 mg/dL Final   Total CHOL/HDL Ratio 11/20/2020 2.7  RATIO Final   VLDL 11/20/2020 21  0 - 40 mg/dL Final   LDL Cholesterol 11/20/2020 81  0 - 99 mg/dL Final   Comment:        Total Cholesterol/HDL:CHD Risk Coronary Heart Disease Risk Table                     Men   Women  1/2 Average Risk   3.4   3.3  Average Risk       5.0   4.4  2 X Average Risk   9.6   7.1  3 X Average Risk  23.4   11.0        Use the calculated Patient Ratio above and the CHD Risk Table to determine the patient's CHD Risk.        ATP III CLASSIFICATION (LDL):  <100     mg/dL   Optimal  100-129  mg/dL   Near or Above                    Optimal  130-159  mg/dL   Borderline  160-189  mg/dL   High  >190     mg/dL   Very  High Performed at [facility] [address] [place], [place] [address]    TSH 11/20/2020 0.977  0.350 - 4.500 uIU/mL Final   Comment: Performed by a 3rd Generation assay with a functional sensitivity of <=0.01 uIU/mL. Performed at [facility], [place] [address] [place],  [address]    SARSCOV2ONAVIRUS 2 AG 11/20/2020 NEGATIVE  NEGATIVE Final   Comment: (NOTE) SARS-CoV-2 antigen NOT DETECTED.   Negative results are presumptive.  Negative results do not preclude SARS-CoV-2 infection and should not be used as the sole basis for treatment or other patient management decisions, including infection  control decisions, particularly in the presence of clinical signs and  symptoms consistent with COVID-19, or in those who have been in contact with the virus.  Negative results must be combined with clinical observations, patient history, and epidemiological information. The expected result is Negative.  Fact Sheet for Patients: HandmadeRecipes.com.cy  Fact Sheet for Healthcare Providers: FuneralLife.at  This test is not yet approved or cleared by the [place] FDA and  has been authorized for detection and/or diagnosis of SARS-CoV-2 by FDA under an Emergency Use Authorization (EUA).  This EUA will remain in effect (meaning this test can be used) for the duration of  the COV                          ID-19 declaration under Section  564(b)(1) of the Act, 21 U.S.C. section 360bbb-3(b)(1), unless the authorization is terminated or revoked sooner.      Allergies: Patient has no known allergies.  PTA Medications: (Not in a hospital admission)   Medical Decision Making  Patient is presenting with passive suicidal ideations and cannot contract for safety.  Patient also requesting help with substance abuse. Patient will be admitted to the facility based crisis unit   Recommendations  Based on my evaluation the patient  does not appear to have an emergency medical condition.  Criteria for IVC not met, IVC rescinded by Dr. Serafina Mitchell.  Patient meets criteria for treatment in the [facility] , admit patient to facility base crisis unit.  Lab work ordered: CBC with differential, CMP, TSH, A1c, lipid panel, magnesium, UDS, U/A EKG ordered.   Lexapro 10 mg daily for depression initiated.   Clinical opiate withdrawal protocol initiated  Revonda Humphrey, NP 11/20/20  5:40 PM

## 2020-11-20 NOTE — ED Notes (Signed)
OOB to BR x1  No interaction with others returned to bed and asleep.

## 2020-11-20 NOTE — ED Notes (Signed)
Blood specimens x1 stick via R ac, Covid test x2 and urine collected per orders and Pt tolerated well. Skin assessment performed x2 staff members. Escorted Pt to the observation unit with introduction to the unit. Sandwich previously provided. Safety maintained and will continue to monitor.

## 2020-11-20 NOTE — ED Notes (Signed)
Pt sleeping no interaction with others.
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
## 2020-11-21 DIAGNOSIS — F332 Major depressive disorder, recurrent severe without psychotic features: Secondary | ICD-10-CM | POA: Diagnosis not present

## 2020-11-21 DIAGNOSIS — Z20822 Contact with and (suspected) exposure to covid-19: Secondary | ICD-10-CM | POA: Diagnosis not present

## 2020-11-21 DIAGNOSIS — R45851 Suicidal ideations: Secondary | ICD-10-CM | POA: Diagnosis not present

## 2020-11-21 MED ORDER — ESCITALOPRAM OXALATE 10 MG PO TABS: 10.0000 mg | ORAL_TABLET | Freq: Every day | ORAL | 0 refills | Status: AC

## 2020-11-21 MED ORDER — ESCITALOPRAM OXALATE 10 MG PO TABS
10.0000 mg | ORAL_TABLET | Freq: Every day | ORAL | Status: DC
  Administered 2020-11-21: 10 mg via ORAL
  Filled 2020-11-21: qty 1

## 2020-11-21 NOTE — ED Notes (Signed)
Patient alert and oriented X 4. Given AVS with follow up community resources. Patient denied SI, HI and AVH at the of discharge.
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
## 2020-11-21 NOTE — ED Notes (Signed)
Pt remains asleep OOB briefly to BR returned to bed and sleep behavior self controlled.

## 2020-11-21 NOTE — Clinical Social Work Psych Note (Signed)
Trauma & Grief  Trauma & Grief (Psychoeducational/Processing Group)  Date: 11/21/20  Type of Therapy/Therapeutic Modalities: Group Discussion, Psycho-Education  Participation Level: Active  Objective: The purpose of this group is to discuss and assist patients in identifying cognitive distortions (negative thinking patterns) that can influence their emotions and behaviors. Facilitators will guide conversations that discuss how these cognitive distortions contribute to common disorders such as anxiety and depression.   Therapeutic Goals:   1. Patient will identify negative thinking patterns they currently experience and how those thoughts influence their behaviors.   2. Patient will begin to explore the possible misinformation and/or traumatic experiences that influence their negative thought patterns.   3. Patient will explore the foundations and techniques of cognitive restructuring by reviewing CBT and DBT techniques.   4. Patient will discuss their reflections and how they plan to implement cognitive restructuring techniques to address cognitive distortions they may experience.  Summary of Patient's Progress:  Calvin Newman was engaged and participated throughout the group session.

## 2020-11-21 NOTE — Progress Notes (Signed)
Patient alert and oriented x 4, denies SI, HI and AVH today. Pain 0/10. Active in groups. Appetite and sleep are good. Nursing staff will continue to monitor.

## 2020-11-21 NOTE — Discharge Instructions (Signed)
   Please come to [facility] (this facility) during walk in hours for appointment with psychiatrist for further medication management and for therapists for therapy.    Walk in hours are 8-11 AM Monday through Thursday for medication management. Therapy walk in hours are Monday-Wednesday 8 AM-1PM.   It is first come, first -serve; it is best to arrive by 7:00 AM.   On Friday from 1 pm to 4 pm for therapy intake only. Please arrive by 12:00 pm as it is  first come, first -serve.    When you arrive please go upstairs for your appointment. If you are unsure of where to go, inform the front desk that you are here for a walk in appointment and they will assist you with directions upstairs.  Address:  [address], in [place], [address] Ph: [phone]   

## 2020-11-21 NOTE — ED Provider Notes (Addendum)
Behavioral Health Progress Note  Date and Time: 11/21/2020 10:14 AM Name: Calvin Newman MRN:  [id number]  Subjective:   [name], 50 y.o., male patient seen face to face by this provider, chart reviewed and discussed with treatment team and Dr. Bronwen Betters on 11/21/20.  Patient was admitted to the facility based crisis unit with worsening depression and suicidal ideations.  On today's reevaluation patient is sitting in the day room having coffee.  He makes good eye contact.  His speech is clear, coherent, normal rate and tone. Patient is calm/cooperative, and alert/oriented. and does not appear to be responding to internal/external stimuli. Patient reports eating/sleeping without difficulty, and tolerating medications without adverse reactions.  He continues to endorse depression and is tearful at times.  States he continues to feel hopeless. Patient is more tearful when he is discussing his situation with his wife.  States she will not answer his calls and states, " I feel like it is driving me crazy". Patient's feelings and progress discussed. Denies suicidal/self-harm/homicidal ideations.  States ,"I have too much to do have too much to do so I am not going to hurt myself".  Denies intent, plan, but does have access to his flip knife.  States it is with all of his other belongings in his truck that is still impounded.  Denies any withdrawal symptoms.  States he does not feel like he has a substance abuse problem.  States he did use cocaine for the past 2 weeks but he is not addicted. States he does not think he needs residential treatment.  Denies paranoia and delusional thought.  Denies any health concerns at this time.   Patient discussed being discharged today. States he has to work on getting his truck and needs to try to talk to his wife.  Encouraged patient to embrace his time on the unit and to participate in group sessions.  Patient agreed to stay and be reevaluated in the morning.   States, " we cannot take it on a day by day basis".Reassurance, support, and encouragement provided.   Collateral: Jacquel Mccamish [phone] patient's spouse.  Patient provided permission for this writer to contact spouse.  Call attempt unsuccessful, mailbox was full and unable to leave message.  Diagnosis:  Final diagnoses:  Severe episode of recurrent major depressive disorder, without psychotic features (HCC)    Total Time spent with patient: 30 minutes  Past Psychiatric History: MDD, polysubstance abuse Past Medical History: No past medical history on file.  Past Surgical History:  Procedure Laterality Date   r hand surgery     fracture 4th and 5th metacarpal   Family History:  Family History  Problem Relation Age of Onset   Cancer Father    Family Psychiatric  History: Unknown Social History:  Social History   Substance and Sexual Activity  Alcohol Use Yes   Comment: occasional     Social History   Substance and Sexual Activity  Drug Use Yes   Types: Cocaine    Social History   Socioeconomic History   Marital status: Married    Spouse name: Not on file   Number of children: Not on file   Years of education: Not on file   Highest education level: Some college, no degree  Occupational History   Not on file  Tobacco Use   Smoking status: Every Day    Packs/day: 0.50    Years: 10.00    Pack years: 5.00    Types: Cigarettes  Smokeless tobacco: Never  Vaping Use   Vaping Use: Never used  Substance and Sexual Activity   Alcohol use: Yes    Comment: occasional   Drug use: Yes    Types: Cocaine   Sexual activity: Yes  Other Topics Concern   Not on file  Social History Narrative   Not on file   Social Determinants of Health   Financial Resource Strain: Not on file  Food Insecurity: Not on file  Transportation Needs: Not on file  Physical Activity: Not on file  Stress: Not on file  Social Connections: Not on file   SDOH:  SDOH Screenings    Alcohol Screen: Not on file  Depression (PHQ2-9): Medium Risk   PHQ-2 Score: 9  Financial Resource Strain: Not on file  Food Insecurity: Not on file  Housing: Not on file  Physical Activity: Not on file  Social Connections: Not on file  Stress: Not on file  Tobacco Use: High Risk   Smoking Tobacco Use: Every Day   Smokeless Tobacco Use: Never  Transportation Needs: Not on file   Additional Social History:    Pain Medications: Please see MAR Prescriptions: Please see MAR Over the Counter: Please see MAR History of alcohol / drug use?: Yes Negative Consequences of Use: Financial, Personal relationships, Work / Programmer, multimedia Name of Substance 1: Crack cocaine 1 - Amount (size/oz): $20-$30 worth 1 - Frequency: Episodic 1 - Duration: Ongoing 1 - Last Use / Amount: 11/19/2020 -- $30 worth street purchase 1 - Method of Aquiring: street purchase Name of Substance 2: Marijuana 2 - Amount (size/oz): Varied 2 - Frequency: Unknown 2 - Duration: Unknown 2 - Last Use / Amount: Unknown    Sleep: Good  Appetite:  Good  Current Medications:  Current Facility-Administered Medications  Medication Dose Route Frequency Provider Last Rate Last Admin   alum & mag hydroxide-simeth (MAALOX/MYLANTA) 200-200-20 MG/5ML suspension 30 mL  30 mL Oral Q4H PRN Ardis Hughs, NP       dicyclomine (BENTYL) tablet 20 mg  20 mg Oral Q6H PRN Ardis Hughs, NP       hydrOXYzine (ATARAX/VISTARIL) tablet 25 mg  25 mg Oral Q6H PRN Ardis Hughs, NP       loperamide (IMODIUM) capsule 2-4 mg  2-4 mg Oral PRN Ardis Hughs, NP       magnesium hydroxide (MILK OF MAGNESIA) suspension 30 mL  30 mL Oral Daily PRN Ardis Hughs, NP       methocarbamol (ROBAXIN) tablet 500 mg  500 mg Oral Q8H PRN Ardis Hughs, NP       naproxen (NAPROSYN) tablet 500 mg  500 mg Oral BID PRN Ardis Hughs, NP       ondansetron (ZOFRAN-ODT) disintegrating tablet 4 mg  4 mg Oral Q6H PRN Ardis Hughs,  NP       traZODone (DESYREL) tablet 50 mg  50 mg Oral QHS PRN Ardis Hughs, NP       No current outpatient medications on file.    Labs  Lab Results:  Admission on 11/20/2020  Component Date Value Ref Range Status   SARS Coronavirus 2 by RT PCR 11/20/2020 NEGATIVE  NEGATIVE Final   Comment: (NOTE) SARS-CoV-2 target nucleic acids are NOT DETECTED.  The SARS-CoV-2 RNA is generally detectable in upper respiratory specimens during the acute phase of infection. The lowest concentration of SARS-CoV-2 viral copies this assay can detect is 138 copies/mL. A negative result does not  preclude SARS-Cov-2 infection and should not be used as the sole basis for treatment or other patient management decisions. A negative result may occur with  improper specimen collection/handling, submission of specimen other than nasopharyngeal swab, presence of viral mutation(s) within the areas targeted by this assay, and inadequate number of viral copies(<138 copies/mL). A negative result must be combined with clinical observations, patient history, and epidemiological information. The expected result is Negative.  Fact Sheet for Patients:  BloggerCourse.comhttps://www.fda.gov/media/152166/download  Fact Sheet for Healthcare Providers:  SeriousBroker.ithttps://www.fda.gov/media/152162/download  This test is no                          t yet approved or cleared by the [place] FDA and  has been authorized for detection and/or diagnosis of SARS-CoV-2 by FDA under an Emergency Use Authorization (EUA). This EUA will remain  in effect (meaning this test can be used) for the duration of the COVID-19 declaration under Section 564(b)(1) of the Act, 21 U.S.C.section 360bbb-3(b)(1), unless the authorization is terminated  or revoked sooner.       Influenza A by PCR 11/20/2020 NEGATIVE  NEGATIVE Final   Influenza B by PCR 11/20/2020 NEGATIVE  NEGATIVE Final   Comment: (NOTE) The Xpert Xpress SARS-CoV-2/FLU/RSV plus assay is  intended as an aid in the diagnosis of influenza from Nasopharyngeal swab specimens and should not be used as a sole basis for treatment. Nasal washings and aspirates are unacceptable for Xpert Xpress SARS-CoV-2/FLU/RSV testing.  Fact Sheet for Patients: BloggerCourse.comhttps://www.fda.gov/media/152166/download  Fact Sheet for Healthcare Providers: SeriousBroker.ithttps://www.fda.gov/media/152162/download  This test is not yet approved or cleared by the [place] FDA and has been authorized for detection and/or diagnosis of SARS-CoV-2 by FDA under an Emergency Use Authorization (EUA). This EUA will remain in effect (meaning this test can be used) for the duration of the COVID-19 declaration under Section 564(b)(1) of the Act, 21 U.S.C. section 360bbb-3(b)(1), unless the authorization is terminated or revoked.  Performed at [facility], [address] [place], [place] [id number]    SARS Coronavirus 2 Ag 11/20/2020 Negative  Negative Final   Color, Urine 11/20/2020 YELLOW  YELLOW Final   APPearance 11/20/2020 HAZY (A) CLEAR Final   Specific Gravity, Urine 11/20/2020 1.027  1.005 - 1.030 Final   pH 11/20/2020 5.0  5.0 - 8.0 Final   Glucose, UA 11/20/2020 NEGATIVE  NEGATIVE mg/dL Final   Hgb urine dipstick 11/20/2020 MODERATE (A) NEGATIVE Final   Bilirubin Urine 11/20/2020 NEGATIVE  NEGATIVE Final   Ketones, ur 11/20/2020 NEGATIVE  NEGATIVE mg/dL Final   Protein, ur 19/14/782908/24/2022 NEGATIVE  NEGATIVE mg/dL Final   Nitrite 56/21/308608/24/2022 NEGATIVE  NEGATIVE Final   Leukocytes,Ua 11/20/2020 NEGATIVE  NEGATIVE Final   RBC / HPF 11/20/2020 11-20  0 - 5 RBC/hpf Final   WBC, UA 11/20/2020 0-5  0 - 5 WBC/hpf Final   Bacteria, UA 11/20/2020 RARE (A) NONE SEEN Final   Squamous Epithelial / LPF 11/20/2020 0-5  0 - 5 Final   Mucus 11/20/2020 PRESENT   Final   Ca Oxalate Crys, UA 11/20/2020 PRESENT   Final   Performed at [facility], [address] [place], [place] [address]   POC Amphetamine UR 11/20/2020 None  Detected  NONE DETECTED (Cut Off Level 1000 ng/mL) Final   POC Secobarbital (BAR) 11/20/2020 None Detected  NONE DETECTED (Cut Off Level 300 ng/mL) Final   POC Buprenorphine (BUP) 11/20/2020 None Detected  NONE DETECTED (Cut Off Level  10 ng/mL) Final   POC Oxazepam (BZO) 11/20/2020 None Detected  NONE DETECTED (Cut Off Level 300 ng/mL) Final   POC Cocaine UR 11/20/2020 None Detected  NONE DETECTED (Cut Off Level 300 ng/mL) Final   POC Methamphetamine UR 11/20/2020 None Detected  NONE DETECTED (Cut Off Level 1000 ng/mL) Final   POC Morphine 11/20/2020 None Detected  NONE DETECTED (Cut Off Level 300 ng/mL) Final   POC Oxycodone UR 11/20/2020 None Detected  NONE DETECTED (Cut Off Level 100 ng/mL) Final   POC Methadone UR 11/20/2020 None Detected  NONE DETECTED (Cut Off Level 300 ng/mL) Final   POC Marijuana UR 11/20/2020 None Detected  NONE DETECTED (Cut Off Level 50 ng/mL) Final   WBC 11/20/2020 4.5  4.0 - 10.5 K/uL Final   RBC 11/20/2020 4.70  4.22 - 5.81 MIL/uL Final   Hemoglobin 11/20/2020 13.7  13.0 - 17.0 g/dL Final   HCT 16/12/9602 41.7  39.0 - 52.0 % Final   MCV 11/20/2020 88.7  80.0 - 100.0 fL Final   MCH 11/20/2020 29.1  26.0 - 34.0 pg Final   MCHC 11/20/2020 32.9  30.0 - 36.0 g/dL Final   RDW 54/11/8117 14.5  11.5 - 15.5 % Final   Platelets 11/20/2020 330  150 - 400 K/uL Final   nRBC 11/20/2020 0.0  0.0 - 0.2 % Final   Neutrophils Relative % 11/20/2020 51  % Final   Neutro Abs 11/20/2020 2.3  1.7 - 7.7 K/uL Final   Lymphocytes Relative 11/20/2020 31  % Final   Lymphs Abs 11/20/2020 1.4  0.7 - 4.0 K/uL Final   Monocytes Relative 11/20/2020 13  % Final   Monocytes Absolute 11/20/2020 0.6  0.1 - 1.0 K/uL Final   Eosinophils Relative 11/20/2020 4  % Final   Eosinophils Absolute 11/20/2020 0.2  0.0 - 0.5 K/uL Final   Basophils Relative 11/20/2020 1  % Final   Basophils Absolute 11/20/2020 0.0  0.0 - 0.1 K/uL Final   Immature Granulocytes 11/20/2020 0  % Final   Abs Immature  Granulocytes 11/20/2020 0.01  0.00 - 0.07 K/uL Final   Performed at [facility], [address], [place], [place] [address]   Sodium 11/20/2020 141  135 - 145 mmol/L Final   Potassium 11/20/2020 4.5  3.5 - 5.1 mmol/L Final   Chloride 11/20/2020 104  98 - 111 mmol/L Final   CO2 11/20/2020 31  22 - 32 mmol/L Final   Glucose, Bld 11/20/2020 78  70 - 99 mg/dL Final   Glucose reference range applies only to samples taken after fasting for at least 8 hours.   BUN 11/20/2020 19  6 - 20 mg/dL Final   Creatinine, Ser 11/20/2020 1.34 (A) 0.61 - 1.24 mg/dL Final   Calcium 95/62/1308 9.4  8.9 - 10.3 mg/dL Final   Total Protein 65/78/4696 7.4  6.5 - 8.1 g/dL Final   Albumin 29/52/8413 3.7  3.5 - 5.0 g/dL Final   AST 24/40/1027 16  15 - 41 U/L Final   ALT 11/20/2020 11  0 - 44 U/L Final   Alkaline Phosphatase 11/20/2020 53  38 - 126 U/L Final   Total Bilirubin 11/20/2020 0.6  0.3 - 1.2 mg/dL Final   GFR, Estimated 11/20/2020 >60  >60 mL/min Final   Comment: (NOTE) Calculated using the CKD-EPI Creatinine Equation (2021)    Anion gap 11/20/2020 6  5 - 15 Final   Performed at [facility], [address] [place], [place] [address]  Hgb A1c MFr Bld 11/20/2020 6.9 (A) 4.8 - 5.6 % Final   Comment: (NOTE) Pre diabetes:          5.7%-6.4%  Diabetes:              >6.4%  Glycemic control for   <7.0% adults with diabetes    Mean Plasma Glucose 11/20/2020 151.33  mg/dL Final   Performed at [facility], [address] [place], [place] [address]   Magnesium 11/20/2020 2.2  1.7 - 2.4 mg/dL Final   Performed at [facility], [address], [place], [place] [address]   Alcohol, Ethyl (B) 11/20/2020 <10  <10 mg/dL Final   Comment: (NOTE) Lowest detectable limit for serum alcohol is 10 mg/dL.  For medical purposes only. Performed at [facility], [address] [place], [place] [address]    Cholesterol 11/20/2020 163  0 - 200 mg/dL Final   Triglycerides 09/98/3382 106   <150 mg/dL Final   HDL 50/53/9767 61  >40 mg/dL Final   Total CHOL/HDL Ratio 11/20/2020 2.7  RATIO Final   VLDL 11/20/2020 21  0 - 40 mg/dL Final   LDL Cholesterol 11/20/2020 81  0 - 99 mg/dL Final   Comment:        Total Cholesterol/HDL:CHD Risk Coronary Heart Disease Risk Table                     Men   Women  1/2 Average Risk   3.4   3.3  Average Risk       5.0   4.4  2 X Average Risk   9.6   7.1  3 X Average Risk  23.4   11.0        Use the calculated Patient Ratio above and the CHD Risk Table to determine the patient's CHD Risk.        ATP III CLASSIFICATION (LDL):  <100     mg/dL   Optimal  341-937  mg/dL   Near or Above                    Optimal  130-159  mg/dL   Borderline  902-409  mg/dL   High  >735     mg/dL   Very High Performed at [facility], [address], [place], [place] [address]    TSH 11/20/2020 0.977  0.350 - 4.500 uIU/mL Final   Comment: Performed by a 3rd Generation assay with a functional sensitivity of <=0.01 uIU/mL. Performed at [facility], [address] [place], [place] [address]    SARSCOV2ONAVIRUS 2 AG 11/20/2020 NEGATIVE  NEGATIVE Final   Comment: (NOTE) SARS-CoV-2 antigen NOT DETECTED.   Negative results are presumptive.  Negative results do not preclude SARS-CoV-2 infection and should not be used as the sole basis for treatment or other patient management decisions, including infection  control decisions, particularly in the presence of clinical signs and  symptoms consistent with COVID-19, or in those who have been in contact with the virus.  Negative results must be combined with clinical observations, patient history, and epidemiological information. The expected result is Negative.  Fact Sheet for Patients: https://www.jennings-kim.com/  Fact Sheet for Healthcare Providers: https://alexander-rogers.biz/  This test is not yet approved or cleared by the [place] FDA and  has been  authorized for detection and/or diagnosis of SARS-CoV-2 by FDA under an Emergency Use Authorization (EUA).  This EUA will remain  in effect (meaning this test can be used) for the duration of  the COV                          ID-19 declaration under Section 564(b)(1) of the Act, 21 U.S.C. section 360bbb-3(b)(1), unless the authorization is terminated or revoked sooner.      Blood Alcohol level:  Lab Results  Component Value Date   ETH <10 11/20/2020   ETH <10 01/08/2018    Metabolic Disorder Labs: Lab Results  Component Value Date   HGBA1C 6.9 (H) 11/20/2020   MPG 151.33 11/20/2020   MPG 136.98 01/10/2018   No results found for: PROLACTIN Lab Results  Component Value Date   CHOL 163 11/20/2020   TRIG 106 11/20/2020   HDL 61 11/20/2020   CHOLHDL 2.7 11/20/2020   VLDL 21 11/20/2020   LDLCALC 81 11/20/2020    Therapeutic Lab Levels: No results found for: LITHIUM No results found for: VALPROATE No components found for:  CBMZ  Physical Findings   AIMS    Flowsheet Row ED from 01/09/2018 in [facility]  AIMS Total Score 0      AUDIT    Flowsheet Row ED from 01/09/2018 in [facility]  Alcohol Use Disorder Identification Test Final Score (AUDIT) 4      PHQ2-9    Flowsheet Row ED from 11/20/2020 in [facility]  PHQ-2 Total Score 4  PHQ-9 Total Score 9      Flowsheet Row ED from 11/20/2020 in [facility] Most recent reading at 11/20/2020  5:24 PM Admission (Discharged) from 01/09/2018 in [facility] Most recent reading at 01/09/2018  5:12 PM ED from 01/09/2018 in [facility] Most recent reading at 01/09/2018  4:06 AM  C-SSRS RISK CATEGORY Low Risk High Risk High Risk        Musculoskeletal  Strength & Muscle Tone: within normal limits Gait & Station:  normal Patient leans: N/A  Psychiatric Specialty Exam  Presentation  General Appearance: Appropriate for Environment; Casual  Eye Contact:Good  Speech:Clear and Coherent; Normal Rate  Speech Volume:Normal  Handedness:Right   Mood and Affect  Mood:Depressed; Worthless; Hopeless  Affect:Congruent; Depressed; Tearful   Thought Process  Thought Processes:Coherent  Descriptions of Associations:Intact  Orientation:Full (Time, Place and Person)  Thought Content:Logical  Diagnosis of Schizophrenia or Schizoaffective disorder in past: No    Hallucinations:Hallucinations: None  Ideas of Reference:None  Suicidal Thoughts:Suicidal Thoughts: No SI Passive Intent and/or Plan: Without Intent; Without Plan; With Access to Means  Homicidal Thoughts:Homicidal Thoughts: No   Sensorium  Memory:Immediate Good; Recent Good; Remote Good  Judgment:Fair  Insight:Fair  Executive Functions  Concentration:Good  Attention Span:Good  Recall:Good  Fund of Knowledge:Good  Language:Good   Psychomotor Activity  Psychomotor Activity:Psychomotor Activity: Normal   Assets  Assets:Communication Skills; Desire for Improvement; Financial Resources/Insurance; Physical Health; Resilience; Vocational/Educational   Sleep  Sleep:Sleep: Fair   No data recorded  Physical Exam  Physical Exam Vitals and nursing note reviewed.  Constitutional:      General: He is not in acute distress.    Appearance: Normal appearance. He is well-developed. He is not ill-appearing.  HENT:     Head: Normocephalic and atraumatic.  Eyes:     General:        Right eye: No discharge.        Left  eye: No discharge.     Conjunctiva/sclera: Conjunctivae normal.  Cardiovascular:     Rate and Rhythm: Normal rate and regular rhythm.     Heart sounds: No murmur heard. Pulmonary:     Effort: Pulmonary effort is normal. No respiratory distress.     Breath sounds: Normal breath sounds.  Abdominal:      Palpations: Abdomen is soft.     Tenderness: no abdominal tenderness  Musculoskeletal:        General: Normal range of motion.     Cervical back: Normal range of motion and neck supple.  Skin:    General: Skin is warm and dry.     Coloration: Skin is not jaundiced or pale.  Neurological:     Mental Status: He is alert and oriented to person, place, and time.  Psychiatric:        Attention and Perception: Attention and perception normal.        Mood and Affect: Mood is depressed. Affect is tearful.        Speech: Speech normal.        Behavior: Behavior normal. Behavior is cooperative.        Thought Content: Thought content normal.        Cognition and Memory: Cognition normal.        Judgment: Judgment normal. Judgment is not impulsive.   Review of Systems  Constitutional: Negative.  Negative for fever.  HENT: Negative.    Eyes: Negative.   Respiratory: Negative.  Negative for cough.   Cardiovascular: Negative.  Negative for chest pain.  Musculoskeletal: Negative.   Skin: Negative.   Neurological: Negative.  Negative for headaches.  Psychiatric/Behavioral:  Negative for depression.   Blood pressure (!) 140/99, pulse 84, temperature (!) 97.5 F (36.4 C), temperature source Oral, resp. rate 18, SpO2 100 %. There is no height or weight on file to calculate BMI.  Treatment Plan Summary: Daily contact with patient to assess and evaluate symptoms and progress in treatment and Medication management  Patient continues to meet criteria for treatment in the [facility].  No medication changes at this time.   Lab work reviewed,  UDS negative EKG QT/QTC 358/433  Ardis Hughs, NP 11/21/2020 10:14 AM
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
## 2020-11-21 NOTE — ED Provider Notes (Addendum)
[facility] Discharge Suicide Risk Assessment   Principal Problem: <principal problem not specified> Discharge Diagnoses: Active Problems:   MDD (major depressive disorder), recurrent severe, without psychosis (HCC)   Total Time spent with patient: 20 minutes  Musculoskeletal: Strength & Muscle Tone: within normal limits Gait & Station: normal Patient leans: N/A  Psychiatric Specialty Exam  Presentation  General Appearance: Appropriate for Environment; Casual  Eye Contact:Good  Speech:Clear and Coherent; Normal Rate  Speech Volume:Normal  Handedness:Right   Mood and Affect  Mood:Dysphoric  Duration of Depression Symptoms: Less than two weeks  Affect:Appropriate; Congruent   Thought Process  Thought Processes:Coherent; Goal Directed; Linear  Descriptions of Associations:Intact  Orientation:Full (Time, Place and Person)  Thought Content:Logical; WDL  History of Schizophrenia/Schizoaffective disorder:No  Duration of Psychotic Symptoms:No data recorded Hallucinations:Hallucinations: None  Ideas of Reference:None  Suicidal Thoughts:Suicidal Thoughts: No  Homicidal Thoughts:Homicidal Thoughts: No   Sensorium  Memory:Immediate Good; Recent Good; Remote Good  Judgment:Fair  Insight:Fair   Executive Functions  Concentration:Good  Attention Span:Good  Recall:Good  Fund of Knowledge:Good  Language:Good   Psychomotor Activity  Psychomotor Activity:Psychomotor Activity: Normal   Assets  Assets:Communication Skills; Desire for Improvement; Financial Resources/Insurance; Physical Health   Sleep  Sleep:Sleep: Fair   Physical Exam: Physical Exam Constitutional:      Appearance: Normal appearance. He is normal weight.  HENT:     Head: Normocephalic and atraumatic.  Eyes:     Extraocular Movements: Extraocular movements intact.  Pulmonary:     Effort: Pulmonary effort is normal.  Neurological:     General: No focal deficit present.     Mental  Status: He is alert and oriented to person, place, and time.  Psychiatric:        Attention and Perception: Attention and perception normal.        Speech: Speech normal.        Behavior: Behavior normal. Behavior is cooperative.        Thought Content: Thought content normal.   Review of Systems  Constitutional:  Negative for chills and fever.  HENT:  Negative for hearing loss.   Eyes:  Negative for discharge and redness.  Respiratory:  Negative for cough.   Cardiovascular:  Negative for chest pain.  Gastrointestinal:  Negative for abdominal pain.  Musculoskeletal:  Negative for myalgias.  Neurological:  Negative for headaches.  Psychiatric/Behavioral:  Positive for depression and substance abuse. Negative for suicidal ideas.   Blood pressure (!) 140/99, pulse 84, temperature (!) 97.5 F (36.4 C), temperature source Oral, resp. rate 18, SpO2 100 %. There is no height or weight on file to calculate BMI.  Mental Status Per Nursing Assessment::   On Admission:     Demographic Factors:  Male and Low socioeconomic status  Loss Factors: Relationship difficulties  Historical Factors: Substance use, previous SI, MDD  Risk Reduction Factors:   Sense of responsibility to family, Employed, and Positive social support  Continued Clinical Symptoms:  Alcohol/Substance Abuse/Dependencies Depression  Cognitive Features That Contribute To Risk:  Thought constriction (tunnel vision)    Suicide Risk:  Mild:  Suicidal ideation of limited frequency, intensity, duration, and specificity.  There are no identifiable plans, no associated intent, mild dysphoria and related symptoms, good self-control (both objective and subjective assessment), few other risk factors, and identifiable protective factors, including available and accessible social support.   Follow-up Information     [facility]. Go to.   Specialty: Behavioral Health Why: Please present for walkin  hours on  weekdays at 0745 AM. Contact information: [address] [place] [address] [phone]                Plan Of Care/Follow-up recommendations:  Activity:  as toelrated Diet:  regular Other:     Patient is instructed prior to discharge to: Take all medications as prescribed by his/her mental healthcare provider. Report any adverse effects and or reactions from the medicines to his/her outpatient provider promptly. Patient has been instructed & cautioned: To not engage in alcohol and or illegal drug use while on prescription medicines. In the event of worsening symptoms, patient is instructed to call the crisis hotline, 911 and or go to the nearest ED for appropriate evaluation and treatment of symptoms. To follow-up with his/her primary care provider for your other medical issues, concerns and or health care needs.     Estella Husk, MD 11/21/2020, 4:26 PM

## 2020-11-21 NOTE — Group Therapy Note (Signed)
Patient slept and refused group.

## 2020-11-22 ENCOUNTER — Encounter: Payer: Self-pay | Admitting: *Deleted

## 2020-11-22 ENCOUNTER — Telehealth: Payer: Self-pay | Admitting: *Deleted

## 2020-11-22 NOTE — Telephone Encounter (Signed)
Contacted [facility] about using [employer] to pay for client's medication.
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
